# Patient Record
Sex: Female | Born: 1975
Health system: Southern US, Community
[De-identification: ages and names within clinical notes are randomized; demographics above are authoritative.]

## PROBLEM LIST (undated history)

## (undated) DIAGNOSIS — N611 Abscess of the breast and nipple: Secondary | ICD-10-CM

## (undated) DIAGNOSIS — C801 Malignant (primary) neoplasm, unspecified: Secondary | ICD-10-CM

## (undated) HISTORY — DX: Abscess of the breast and nipple: N61.1

## (undated) HISTORY — PX: BREAST LUMPECTOMY: SHX2

---

## 2009-11-24 HISTORY — PX: APPENDECTOMY: SHX54

## 2012-09-27 ENCOUNTER — Other Ambulatory Visit: Payer: Self-pay | Admitting: Family Medicine

## 2012-09-27 DIAGNOSIS — R1903 Right lower quadrant abdominal swelling, mass and lump: Secondary | ICD-10-CM

## 2012-09-29 ENCOUNTER — Ambulatory Visit
Admission: RE | Admit: 2012-09-29 | Discharge: 2012-09-29 | Disposition: A | Discharge status: Home / Self Care | Payer: BC Managed Care – PPO | Source: Ambulatory Visit | Attending: Family Medicine | Admitting: Family Medicine

## 2012-09-29 DIAGNOSIS — R1903 Right lower quadrant abdominal swelling, mass and lump: Secondary | ICD-10-CM

## 2012-09-29 MED ORDER — IOHEXOL 300 MG/ML  SOLN
100.0000 mL | Freq: Once | INTRAMUSCULAR | Status: AC | PRN
  Administered 2012-09-29: 100 mL via INTRAVENOUS

## 2013-08-15 ENCOUNTER — Other Ambulatory Visit: Payer: Self-pay

## 2013-08-18 ENCOUNTER — Encounter (HOSPITAL_COMMUNITY): Payer: Self-pay | Admitting: *Deleted

## 2013-08-18 ENCOUNTER — Emergency Department (HOSPITAL_COMMUNITY)
Admission: EM | Admit: 2013-08-18 | Discharge: 2013-08-18 | Disposition: A | Discharge status: Home / Self Care | Payer: BC Managed Care – PPO | Attending: Emergency Medicine | Admitting: Emergency Medicine

## 2013-08-18 ENCOUNTER — Emergency Department (HOSPITAL_COMMUNITY): Payer: BC Managed Care – PPO

## 2013-08-18 ENCOUNTER — Other Ambulatory Visit: Payer: Self-pay | Admitting: Family Medicine

## 2013-08-18 DIAGNOSIS — N6452 Nipple discharge: Secondary | ICD-10-CM

## 2013-08-18 DIAGNOSIS — N63 Unspecified lump in unspecified breast: Secondary | ICD-10-CM | POA: Insufficient documentation

## 2013-08-18 DIAGNOSIS — N6310 Unspecified lump in the right breast, unspecified quadrant: Secondary | ICD-10-CM

## 2013-08-18 DIAGNOSIS — N6459 Other signs and symptoms in breast: Secondary | ICD-10-CM | POA: Insufficient documentation

## 2013-08-18 LAB — BASIC METABOLIC PANEL
CO2: 25 mEq/L (ref 19–32)
Chloride: 102 mEq/L (ref 96–112)
GFR calc Af Amer: >90 mL/min (ref >90)
GFR calc non Af Amer: >90 mL/min (ref >90)
Potassium: 3.9 mEq/L (ref 3.5–5.1)
Sodium: 137 mEq/L (ref 135–145)

## 2013-08-18 LAB — CBC WITH DIFFERENTIAL/PLATELET
Basophils Relative: 0 % (ref 0–1)
Eosinophils Absolute: 0.1 10*3/uL (ref 0.0–0.7)
HCT: 36.6 % (ref 36.0–46.0)
Lymphocytes Relative: 31 % (ref 12–46)
MCH: 31 pg (ref 26.0–34.0)
MCHC: 34.7 g/dL (ref 30.0–36.0)
Monocytes Absolute: 0.9 10*3/uL (ref 0.1–1.0)
Neutrophils Relative %: 58 % (ref 43–77)
Platelets: 295 10*3/uL (ref 150–400)

## 2013-08-18 MED ORDER — HYDROCODONE-ACETAMINOPHEN 5-325 MG PO TABS: 2.0000 | ORAL_TABLET | ORAL | Status: DC | PRN

## 2013-08-18 NOTE — ED Notes (Addendum)
Reports pain to right breast since Monday, has been to pcp and started on antibiotics but now pt also having a discharge from right nipple. No acute distress noted at triage. Denies fever/chills

## 2013-08-18 NOTE — ED Provider Notes (Signed)
CSN: [id number]     Arrival date & time 08/18/13  1835 History  This chart was scribed for non-physician practitioner, Marlon Pel, PA-C working with Junius Argyle, MD by Greggory Stallion, ED scribe. This patient was seen in room TR09C/TR09C and the patient's care was started at 8:09 PM.   Chief Complaint  Patient presents with  . Abscess   The history is provided by the patient. No language interpreter was used.    HPI Comments: Kaylee Brewer is a 37 y.o. female who presents to the Emergency Department complaining of gradual onset, constant right breast pain that started 6 days ago. She has been to her PCP 3 days ago and was given antibiotics Keflex and started it 2 days ago. Pt states when she pressed her nipple yesterday and had a white discharge. She states there is only pain now when she presses on her right breast or when she stands. Pt states when she doesn't have a bra on, the pain worsens. Pt denies fever and chills.  History reviewed. No pertinent past medical history. History reviewed. No pertinent past surgical history. History reviewed. No pertinent family history. History  Substance Use Topics  . Smoking status: Never Smoker   . Smokeless tobacco: Not on file  . Alcohol Use: No   OB History   Grav Para Term Preterm Abortions TAB SAB Ect Mult Living                 Review of Systems  Constitutional: Negative for fever and chills.  All other systems reviewed and are negative.    Allergies  Review of patient's allergies indicates no known allergies.  Home Medications   Current Outpatient Rx  Name  Route  Sig  Dispense  Refill  . cephALEXin (KEFLEX) 500 MG capsule   Oral   Take 500 mg by mouth every 6 (six) hours.         Marland Kitchen ibuprofen (ADVIL,MOTRIN) 200 MG tablet   Oral   Take 200 mg by mouth every 6 (six) hours as needed for pain.         Marland Kitchen HYDROcodone-acetaminophen (NORCO/VICODIN) 5-325 MG per tablet   Oral   Take 2 tablets by mouth every 4 (four)  hours as needed for pain.   20 tablet   0    BP 102/70  Pulse 72  Temp(Src) 98.1 F (36.7 C) (Oral)  Resp 16  SpO2 99%  LMP 07/30/2013  Physical Exam  Nursing note and vitals reviewed. Constitutional: She is oriented to person, place, and time. She appears well-developed and well-nourished. No distress.  HENT:  Head: Normocephalic and atraumatic.  Eyes: EOM are normal.  Neck: Neck supple. No tracheal deviation present.  Cardiovascular: Normal rate.   Pulmonary/Chest: Effort normal. No respiratory distress. Right breast exhibits mass, nipple discharge and tenderness. Right breast exhibits no inverted nipple and no skin change. Left breast exhibits no inverted nipple, no mass, no nipple discharge, no skin change and no tenderness.    Musculoskeletal: Normal range of motion.  Neurological: She is alert and oriented to person, place, and time.  Skin: Skin is warm and dry.  Psychiatric: She has a normal mood and affect. Her behavior is normal.    ED Course  Procedures (including critical care time)  DIAGNOSTIC STUDIES: Oxygen Saturation is 99% on RA, normal by my interpretation.    COORDINATION OF CARE: 8:12 PM-Discussed treatment plan which includes speaking with Dr. Romeo Apple with pt at bedside and pt agreed to  plan.   8:31 PM-Dr. Romeo Apple suggested doing an ultrasound. Pt denies pain medication at this time.   Labs Review Labs Reviewed  CBC WITH DIFFERENTIAL  BASIC METABOLIC PANEL   Imaging Review US Breast Right  08/18/2013   CLINICAL DATA:  Right breast swelling. Question abscess.  EXAM: ULTRASOUND RIGHT BREAST  COMPARISON:  None.  FINDINGS: This examination was performed through the emergency department specifically to evaluate for a possible breast abscess. There is mild parenchymal heterogeneity without focal fluid collection.  IMPRESSION: No evidence of right breast abscess.  RECOMMENDATION: Close clinical follow up. If there are persistent or progressive symptoms,  dedicated evaluation in the Breast Center recommended.   Electronically Signed   By: Roxy Horseman   On: 08/18/2013 22:25    MDM   1. Breast mass   2. Nipple discharge    Ultrasound r/o abscess. I spoke with gynecology (who does not specialize in breasts) and they advise that patients needs mammogram tomorrow and to treat pain. Pt has no white count and vital signs are stable.  I discussed labs and ultrasound results with the patient. She is understanding. She informs me that she has a mammogram scheduled for 8am tomorrow morning that was ordered by her PCP. I advised her that she really really needs to go to this appointment.  Rx: Vicodin Continue Keflex  37 y.o.Kaylee Brewer evaluation in the Emergency Department is complete. It has been determined that no acute conditions requiring further emergency intervention are present at this time. The patient/guardian have been advised of the diagnosis and plan. We have discussed signs and symptoms that warrant return to the ED, such as changes or worsening in symptoms.  Vital signs are stable at discharge. Filed Vitals:   08/18/13 2313  BP: 100/67  Pulse: 76  Temp: 98.4 F (36.9 C)  Resp: 16    Patient/guardian has voiced understanding and agreed to follow-up with the PCP or specialist.  I personally performed the services described in this documentation, which was scribed in my presence. The recorded information has been reviewed and is accurate.      Dorthula Matas, PA-C 08/18/13 2315

## 2013-08-19 NOTE — ED Provider Notes (Signed)
Medical screening examination/treatment/procedure(s) were conducted as a shared visit with non-physician practitioner(s) and myself.  I personally evaluated the patient during the encounter  I interviewed and examined the patient. Lungs are CTAB. Cardiac exam wnl. Abdomen soft. Breast mass in right breast, LUQ. Yellowish nipple d/c expressed on exam.  [place] w/out evidence of abscess. Case discussed by Neva Seat PA w/ on call ob/gyn. Will have pt continue abx and f/u w/ breast center.   Junius Argyle, MD 08/19/13 [phone]

## 2013-08-29 ENCOUNTER — Other Ambulatory Visit: Payer: BC Managed Care – PPO

## 2013-10-24 ENCOUNTER — Encounter (INDEPENDENT_AMBULATORY_CARE_PROVIDER_SITE_OTHER): Payer: Self-pay | Admitting: Surgery

## 2013-10-24 ENCOUNTER — Ambulatory Visit (INDEPENDENT_AMBULATORY_CARE_PROVIDER_SITE_OTHER): Payer: BC Managed Care – PPO | Admitting: Surgery

## 2013-10-24 ENCOUNTER — Encounter (INDEPENDENT_AMBULATORY_CARE_PROVIDER_SITE_OTHER): Payer: Self-pay

## 2013-10-24 VITALS — BP 110/60 | HR 84 | Temp 97.8°F | Resp 18 | Ht 64.5 in | Wt 124.0 lb

## 2013-10-24 DIAGNOSIS — N611 Abscess of the breast and nipple: Secondary | ICD-10-CM

## 2013-10-24 DIAGNOSIS — N61 Mastitis without abscess: Secondary | ICD-10-CM

## 2013-10-24 MED ORDER — SULFAMETHOXAZOLE-TMP DS 800-160 MG PO TABS: 1.0000 | ORAL_TABLET | Freq: Two times a day (BID) | ORAL | Status: DC

## 2013-10-24 NOTE — Progress Notes (Signed)
Subjective:     Patient ID: Kaylee Brewer, female   DOB: Apr 25, 1976, 37 y.o.   MRN: [id number]  HPI She presents to our emergency office referred by Dr. Mila Palmer For evaluation of a right breast abscess. She apparently had cellulitis of the breast in September. Mammogram and ultrasound that time were unremarkable. She was placed on antibiotics. She reports she then had trauma to the right breast and developed a hematoma. She now has pain in the breast. She apparently has had an ultrasound and mammogram at [facility].  Review of Systems     Objective:   Physical Exam On the right breast there is an area of fluctuance with minimal erythema above the areola. I inserted an 18-gauge needle and found purulence. This a larger incision with a scalpel entering an abscess cavity. I then packed the wound with gauze    Assessment:     Right breast abscess     Plan:     She will start wet-to-dry dressing changes twice daily and I will see her back in one week.

## 2013-10-25 ENCOUNTER — Ambulatory Visit (INDEPENDENT_AMBULATORY_CARE_PROVIDER_SITE_OTHER): Payer: BC Managed Care – PPO

## 2013-10-25 DIAGNOSIS — Z4800 Encounter for change or removal of nonsurgical wound dressing: Secondary | ICD-10-CM

## 2013-10-25 NOTE — Progress Notes (Signed)
Pt is s/p I & D of a right breast abscess by Dr. Magnus Ivan on 10/24/13.  She is here today for a dressing change, and to observe so she can change same BID as prescribed by Dr. Magnus Ivan.  Outer bandage was removed along with the gauze packing.  Very minimal bleeding.  Wound was repacked with a saline moistened 2x2, then covered with 4x4 gauze and tape.  The pt now feels more comfortable about changing the dressing herself.  She is to return to see Dr. Magnus Ivan 11/03/13 at 4:10pm.

## 2013-10-26 ENCOUNTER — Ambulatory Visit (INDEPENDENT_AMBULATORY_CARE_PROVIDER_SITE_OTHER): Payer: BC Managed Care – PPO | Admitting: General Surgery

## 2013-10-28 ENCOUNTER — Ambulatory Visit (INDEPENDENT_AMBULATORY_CARE_PROVIDER_SITE_OTHER): Payer: BC Managed Care – PPO | Admitting: Surgery

## 2013-11-03 ENCOUNTER — Encounter (INDEPENDENT_AMBULATORY_CARE_PROVIDER_SITE_OTHER): Payer: Self-pay | Admitting: Surgery

## 2013-11-03 ENCOUNTER — Ambulatory Visit (INDEPENDENT_AMBULATORY_CARE_PROVIDER_SITE_OTHER): Payer: BC Managed Care – PPO | Admitting: Surgery

## 2013-11-03 VITALS — BP 98/68 | HR 68 | Temp 97.3°F | Resp 14 | Ht 64.0 in | Wt 126.2 lb

## 2013-11-03 DIAGNOSIS — Z09 Encounter for follow-up examination after completed treatment for conditions other than malignant neoplasm: Secondary | ICD-10-CM

## 2013-11-03 NOTE — Patient Instructions (Signed)
Pack the wound only once a day now for one week and then you can stop packing the wound  Ok to was the wound with soap and water in the shower

## 2013-11-03 NOTE — Progress Notes (Signed)
Subjective:     Patient ID: Kaylee Brewer, female   DOB: 07/08/1976, 37 y.o.   MRN: [id number]  HPI She is here for her first postop visit status post incision and drainage of a right breast abscess. She is packing the abscess cavity twice a day. She is doing well. She just finished her antibiotics  Review of Systems     Objective:   Physical Exam On exam, the small abscess cavity is healing well. There is no purulence and no cellulitis    Assessment:     Resolving right breast abscess     Plan:     She will do the packing only once a day now for 1 more week and then stop packing the wound. I will see her back in 3 weeks

## 2013-11-21 ENCOUNTER — Encounter (INDEPENDENT_AMBULATORY_CARE_PROVIDER_SITE_OTHER): Payer: BC Managed Care – PPO | Admitting: Surgery

## 2014-01-25 LAB — OB RESULTS CONSOLE HIV ANTIBODY (ROUTINE TESTING): HIV: NONREACTIVE

## 2014-01-25 LAB — OB RESULTS CONSOLE ANTIBODY SCREEN: Antibody Screen: NEGATIVE

## 2014-01-25 LAB — OB RESULTS CONSOLE ABO/RH: RH TYPE: NEGATIVE

## 2014-01-25 LAB — OB RESULTS CONSOLE RUBELLA ANTIBODY, IGM: Rubella: IMMUNE

## 2014-01-25 LAB — OB RESULTS CONSOLE HEPATITIS B SURFACE ANTIGEN: Hepatitis B Surface Ag: NEGATIVE

## 2014-01-25 LAB — OB RESULTS CONSOLE RPR: RPR: NONREACTIVE

## 2014-02-07 LAB — OB RESULTS CONSOLE GC/CHLAMYDIA
CHLAMYDIA, DNA PROBE: NEGATIVE
Gonorrhea: NEGATIVE

## 2014-08-02 LAB — OB RESULTS CONSOLE GBS: GBS: POSITIVE

## 2014-08-27 ENCOUNTER — Encounter (HOSPITAL_COMMUNITY): Payer: BC Managed Care – PPO | Admitting: Anesthesiology

## 2014-08-27 ENCOUNTER — Inpatient Hospital Stay (HOSPITAL_COMMUNITY): Payer: BC Managed Care – PPO | Admitting: Anesthesiology

## 2014-08-27 ENCOUNTER — Inpatient Hospital Stay (HOSPITAL_COMMUNITY)
Admission: AD | Admit: 2014-08-27 | Discharge: 2014-08-27 | Disposition: A | Discharge status: Home / Self Care | Payer: BC Managed Care – PPO | Source: Ambulatory Visit | Attending: Obstetrics and Gynecology | Admitting: Obstetrics and Gynecology

## 2014-08-27 ENCOUNTER — Encounter (HOSPITAL_COMMUNITY): Payer: Self-pay | Admitting: *Deleted

## 2014-08-27 ENCOUNTER — Inpatient Hospital Stay (HOSPITAL_COMMUNITY)
Admission: AD | Admit: 2014-08-27 | Discharge: 2014-08-29 | DRG: 775 | Disposition: A | Discharge status: Home / Self Care | Payer: BC Managed Care – PPO | Source: Ambulatory Visit | Attending: Obstetrics and Gynecology | Admitting: Obstetrics and Gynecology

## 2014-08-27 DIAGNOSIS — O09523 Supervision of elderly multigravida, third trimester: Secondary | ICD-10-CM

## 2014-08-27 DIAGNOSIS — Z3A39 39 weeks gestation of pregnancy: Secondary | ICD-10-CM | POA: Diagnosis present

## 2014-08-27 DIAGNOSIS — IMO0001 Reserved for inherently not codable concepts without codable children: Secondary | ICD-10-CM

## 2014-08-27 DIAGNOSIS — Z3483 Encounter for supervision of other normal pregnancy, third trimester: Secondary | ICD-10-CM | POA: Diagnosis present

## 2014-08-27 LAB — RPR

## 2014-08-27 LAB — CBC
HCT: 37.2 % (ref 36.0–46.0)
Hemoglobin: 12.7 g/dL (ref 12.0–15.0)
MCH: 32 pg (ref 26.0–34.0)
MCHC: 34.1 g/dL (ref 30.0–36.0)
MCV: 93.7 fL (ref 78.0–100.0)
PLATELETS: 224 10*3/uL (ref 150–400)
RBC: 3.97 MIL/uL (ref 3.87–5.11)
RDW: 15.3 % (ref 11.5–15.5)
WBC: 21.1 10*3/uL — ABNORMAL HIGH (ref 4.0–10.5)

## 2014-08-27 MED ORDER — EPHEDRINE 5 MG/ML INJ: 10.0000 mg | INTRAVENOUS | Status: DC | PRN

## 2014-08-27 MED ORDER — WITCH HAZEL-GLYCERIN EX PADS: 1.0000 "application " | MEDICATED_PAD | CUTANEOUS | Status: DC | PRN

## 2014-08-27 MED ORDER — OXYTOCIN BOLUS FROM INFUSION: 500.0000 mL | INTRAVENOUS | Status: DC

## 2014-08-27 MED ORDER — OXYCODONE-ACETAMINOPHEN 5-325 MG PO TABS: 1.0000 | ORAL_TABLET | ORAL | Status: DC | PRN

## 2014-08-27 MED ORDER — FLEET ENEMA 7-19 GM/118ML RE ENEM: 1.0000 | ENEMA | Freq: Every day | RECTAL | Status: DC | PRN

## 2014-08-27 MED ORDER — SODIUM CHLORIDE 0.9 % IV SOLN
2.0000 g | Freq: Once | INTRAVENOUS | Status: AC
  Administered 2014-08-27: 2 g via INTRAVENOUS
  Filled 2014-08-27: qty 2000

## 2014-08-27 MED ORDER — IBUPROFEN 600 MG PO TABS
600.0000 mg | ORAL_TABLET | Freq: Four times a day (QID) | ORAL | Status: DC
  Administered 2014-08-27 – 2014-08-29 (×6): 600 mg via ORAL
  Filled 2014-08-27 (×6): qty 1

## 2014-08-27 MED ORDER — BUTORPHANOL TARTRATE 1 MG/ML IJ SOLN
1.0000 mg | INTRAMUSCULAR | Status: DC | PRN
  Administered 2014-08-27: 1 mg via INTRAVENOUS
  Filled 2014-08-27: qty 1

## 2014-08-27 MED ORDER — LACTATED RINGERS IV SOLN: 500.0000 mL | Freq: Once | INTRAVENOUS | Status: DC

## 2014-08-27 MED ORDER — CITRIC ACID-SODIUM CITRATE 334-500 MG/5ML PO SOLN: 30.0000 mL | ORAL | Status: DC | PRN

## 2014-08-27 MED ORDER — LIDOCAINE HCL (PF) 1 % IJ SOLN
INTRAMUSCULAR | Status: DC | PRN
  Administered 2014-08-27 (×2): 5 mL

## 2014-08-27 MED ORDER — DIPHENHYDRAMINE HCL 50 MG/ML IJ SOLN: 12.5000 mg | INTRAMUSCULAR | Status: DC | PRN

## 2014-08-27 MED ORDER — ONDANSETRON HCL 4 MG/2ML IJ SOLN: 4.0000 mg | INTRAMUSCULAR | Status: DC | PRN

## 2014-08-27 MED ORDER — LANOLIN HYDROUS EX OINT: TOPICAL_OINTMENT | CUTANEOUS | Status: DC | PRN

## 2014-08-27 MED ORDER — LIDOCAINE HCL (PF) 1 % IJ SOLN
30.0000 mL | INTRAMUSCULAR | Status: DC | PRN
  Filled 2014-08-27: qty 30

## 2014-08-27 MED ORDER — ACETAMINOPHEN 325 MG PO TABS: 650.0000 mg | ORAL_TABLET | ORAL | Status: DC | PRN

## 2014-08-27 MED ORDER — ONDANSETRON HCL 4 MG/2ML IJ SOLN: 4.0000 mg | Freq: Four times a day (QID) | INTRAMUSCULAR | Status: DC | PRN

## 2014-08-27 MED ORDER — ONDANSETRON HCL 4 MG PO TABS
4.0000 mg | ORAL_TABLET | ORAL | Status: DC | PRN
Start: 2014-08-27 — End: 2014-08-29

## 2014-08-27 MED ORDER — FLEET ENEMA 7-19 GM/118ML RE ENEM: 1.0000 | ENEMA | RECTAL | Status: DC | PRN

## 2014-08-27 MED ORDER — LACTATED RINGERS IV SOLN: 500.0000 mL | INTRAVENOUS | Status: DC | PRN

## 2014-08-27 MED ORDER — SIMETHICONE 80 MG PO CHEW: 80.0000 mg | CHEWABLE_TABLET | ORAL | Status: DC | PRN

## 2014-08-27 MED ORDER — OXYCODONE-ACETAMINOPHEN 5-325 MG PO TABS: 2.0000 | ORAL_TABLET | ORAL | Status: DC | PRN

## 2014-08-27 MED ORDER — ZOLPIDEM TARTRATE 5 MG PO TABS: 5.0000 mg | ORAL_TABLET | Freq: Every evening | ORAL | Status: DC | PRN

## 2014-08-27 MED ORDER — PHENYLEPHRINE 40 MCG/ML (10ML) SYRINGE FOR IV PUSH (FOR BLOOD PRESSURE SUPPORT): 80.0000 ug | PREFILLED_SYRINGE | INTRAVENOUS | Status: DC | PRN

## 2014-08-27 MED ORDER — DIPHENHYDRAMINE HCL 25 MG PO CAPS: 25.0000 mg | ORAL_CAPSULE | Freq: Four times a day (QID) | ORAL | Status: DC | PRN

## 2014-08-27 MED ORDER — LACTATED RINGERS IV SOLN
INTRAVENOUS | Status: DC
  Administered 2014-08-27 (×2): via INTRAVENOUS

## 2014-08-27 MED ORDER — DIBUCAINE 1 % RE OINT
1.0000 "application " | TOPICAL_OINTMENT | RECTAL | Status: DC | PRN
  Filled 2014-08-27: qty 28

## 2014-08-27 MED ORDER — BENZOCAINE-MENTHOL 20-0.5 % EX AERO
1.0000 | INHALATION_SPRAY | CUTANEOUS | Status: DC | PRN
Start: 2014-08-27 — End: 2014-08-29
  Filled 2014-08-27 (×2): qty 56

## 2014-08-27 MED ORDER — TETANUS-DIPHTH-ACELL PERTUSSIS 5-2.5-18.5 LF-MCG/0.5 IM SUSP: 0.5000 mL | Freq: Once | INTRAMUSCULAR | Status: DC

## 2014-08-27 MED ORDER — PHENYLEPHRINE 40 MCG/ML (10ML) SYRINGE FOR IV PUSH (FOR BLOOD PRESSURE SUPPORT)
PREFILLED_SYRINGE | INTRAVENOUS | Status: AC
  Filled 2014-08-27: qty 5

## 2014-08-27 MED ORDER — FENTANYL 2.5 MCG/ML BUPIVACAINE 1/10 % EPIDURAL INFUSION (WH - ANES)
INTRAMUSCULAR | Status: AC
  Administered 2014-08-27: 14 mL/h via EPIDURAL
  Filled 2014-08-27: qty 125

## 2014-08-27 MED ORDER — LEVOTHYROXINE SODIUM 25 MCG PO TABS
25.0000 ug | ORAL_TABLET | Freq: Every day | ORAL | Status: DC
  Filled 2014-08-27: qty 1

## 2014-08-27 MED ORDER — FENTANYL 2.5 MCG/ML BUPIVACAINE 1/10 % EPIDURAL INFUSION (WH - ANES): 14.0000 mL/h | INTRAMUSCULAR | Status: DC | PRN

## 2014-08-27 MED ORDER — PRENATAL MULTIVITAMIN CH
1.0000 | ORAL_TABLET | Freq: Every day | ORAL | Status: DC
  Administered 2014-08-28: 1 via ORAL
  Filled 2014-08-27: qty 1

## 2014-08-27 MED ORDER — SENNOSIDES-DOCUSATE SODIUM 8.6-50 MG PO TABS
2.0000 | ORAL_TABLET | ORAL | Status: DC
  Administered 2014-08-28: 2 via ORAL
  Filled 2014-08-27: qty 2

## 2014-08-27 MED ORDER — OXYTOCIN 40 UNITS IN LACTATED RINGERS INFUSION - SIMPLE MED
62.5000 mL/h | INTRAVENOUS | Status: DC
  Filled 2014-08-27: qty 1000

## 2014-08-27 MED ORDER — BISACODYL 10 MG RE SUPP
10.0000 mg | Freq: Every day | RECTAL | Status: DC | PRN
  Filled 2014-08-27: qty 1

## 2014-08-27 NOTE — MAU Note (Signed)
Assumed care of patient.

## 2014-08-27 NOTE — H&P (Signed)
Kaylee Brewer is a 38 y.o. female presenting for contractions. No ROM. Maternal Medical History:  Reason for admission: Contractions.   Fetal activity: Perceived fetal activity is normal.      OB History   Grav Para Term Preterm Abortions TAB SAB Ect Mult Living   3 1 1  1 1    1      Past Medical History  Diagnosis Date  . Breast abscess    Past Surgical History  Procedure Laterality Date  . Appendectomy  2011   Family History: family history includes Thyroid cancer in her mother; Uterine cancer in her mother. Social History:  reports that she has never smoked. She has never used smokeless tobacco. She reports that she does not drink alcohol or use illicit drugs.   Prenatal Transfer Tool  Maternal Diabetes: No Genetic Screening: Normal Maternal Ultrasounds/Referrals: Normal Fetal Ultrasounds or other Referrals:  None Maternal Substance Abuse:  No Significant Maternal Medications:  Meds include: Syntroid Significant Maternal Lab Results:  None Other Comments:  None  Review of Systems  Eyes: Negative for blurred vision.  Gastrointestinal: Negative for abdominal pain.  Neurological: Negative for headaches.      Blood pressure 126/66, pulse 85, temperature 98.5 F (36.9 C), temperature source Oral, resp. rate 20, height 5' 4.96" (1.65 m), weight 70.308 kg (155 lb), last menstrual period 11/24/2013. Maternal Exam:  Uterine Assessment: Contraction strength is firm.  Contraction frequency is regular.      Fetal Exam Fetal State Assessment: Category I - tracings are normal.     Physical Exam  Cardiovascular: Normal rate.   Respiratory: Effort normal.  Neurological: She has normal reflexes.    Prenatal labs: ABO, Rh:   Antibody:   Rubella:   RPR:    HBsAg:    HIV:    GBS:     Assessment/Plan: 38 yo G3P1 at 46 3/7 weeks in active labor   Kaylee Brewer,Kaylee Brewer 08/27/2014, 5:03 PM

## 2014-08-27 NOTE — Anesthesia Procedure Notes (Signed)
Epidural Patient location during procedure: OB Start time: 08/27/2014 5:26 PM  Staffing Anesthesiologist: Rudean Curt Performed by: anesthesiologist   Preanesthetic Checklist Completed: patient identified, site marked, surgical consent, pre-op evaluation, timeout performed, IV checked, risks and benefits discussed and monitors and equipment checked  Epidural Patient position: sitting Prep: site prepped and draped and DuraPrep Patient monitoring: continuous pulse ox and blood pressure Approach: midline Location: L3-L4 Injection technique: LOR air  Needle:  Needle type: Tuohy  Needle gauge: 17 G Needle length: 9 cm and 9 Needle insertion depth: 4 cm Catheter type: closed end flexible Catheter size: 19 Gauge Catheter at skin depth: 10 cm Test dose: negative  Assessment Events: blood not aspirated, injection not painful, no injection resistance, negative IV test and no paresthesia  Additional Notes Patient identified.  Risk benefits discussed including failed block, incomplete pain control, headache, nerve damage, paralysis, blood pressure changes, nausea, vomiting, reactions to medication both toxic or allergic, and postpartum back pain.  Patient expressed understanding and wished to proceed.  All questions were answered.  Sterile technique used throughout procedure and epidural site dressed with sterile barrier dressing. No paresthesia or other complications noted.The patient did not experience any signs of intravascular injection such as tinnitus or metallic taste in mouth nor signs of intrathecal spread such as rapid motor block. Please see nursing notes for vital signs.

## 2014-08-27 NOTE — Discharge Instructions (Signed)

## 2014-08-27 NOTE — Anesthesia Preprocedure Evaluation (Signed)

## 2014-08-27 NOTE — Progress Notes (Signed)
Delivery Note At 8:03 PM a viable female was delivered via Vaginal, Spontaneous Delivery (Presentation: ; Occiput Anterior).  APGAR: 8,9 ; weight pending .   Placenta status: Intact, Spontaneous.  Cord: 3 vessels with the following complications: None.  Cord pH: pending  Anesthesia: Epidural  Episiotomy: None Lacerations: 2nd degree;Perineal Suture Repair: 2.0 vicryl rapide Est. Blood Loss (mL):   Mom to postpartum.  Baby to Couplet care / Skin to Skin.  [name],[name] E 08/27/2014, 8:16 PM

## 2014-08-27 NOTE — MAU Note (Signed)
Pt presents to MAU with complaints of contractions and small amount of bright red bleeding this morning.

## 2014-08-27 NOTE — Progress Notes (Signed)
Cx rim/C/0 AROM of BBOW-clear FHT reactive UCs q2-3 min

## 2014-08-27 NOTE — MAU Note (Signed)
See this morning, contractions closer together.

## 2014-08-28 ENCOUNTER — Encounter (HOSPITAL_COMMUNITY): Payer: Self-pay

## 2014-08-28 LAB — CBC
HCT: 34.3 % — ABNORMAL LOW (ref 36.0–46.0)
Hemoglobin: 11.8 g/dL — ABNORMAL LOW (ref 12.0–15.0)
MCH: 32.3 pg (ref 26.0–34.0)
MCHC: 34.4 g/dL (ref 30.0–36.0)
MCV: 94 fL (ref 78.0–100.0)
Platelets: 197 10*3/uL (ref 150–400)
RBC: 3.65 MIL/uL — ABNORMAL LOW (ref 3.87–5.11)
RDW: 15.3 % (ref 11.5–15.5)
WBC: 17.3 10*3/uL — ABNORMAL HIGH (ref 4.0–10.5)

## 2014-08-28 MED ORDER — RHO D IMMUNE GLOBULIN 1500 UNIT/2ML IJ SOSY
300.0000 ug | PREFILLED_SYRINGE | Freq: Once | INTRAMUSCULAR | Status: AC
  Administered 2014-08-28: 300 ug via INTRAMUSCULAR
  Filled 2014-08-28: qty 2

## 2014-08-28 NOTE — Lactation Note (Signed)
This note was copied from the chart of Kaylee Brewer. Lactation Consultation Note: Initial visit with this experienced BF mom Baby now 22 hours old. She reports that baby has been nursing better. Having a little trouble getting latched to the left breast- nipple is a little flatter but he has latched to that breast. Last fed at 5 am. Mom heading to bathroom. Encouraged to place baby skin to skin to see if he will arouse and nurse. Reviewed feeding cues and encouraged to feed whenever she sees them. Skin to skin as much as possible today. Has not voided yet 2 stools Discussed cluster feeding. BF brochure given with resources for support after DC. No questions at present. To call for assist prn.  Patient Name: Kaylee Brewer BULAG'T Date: 08/28/2014 Reason for consult: Initial assessment   Maternal Data Formula Feeding for Exclusion: No Does the patient have breastfeeding experience prior to this delivery?: Yes  Feeding    LATCH Score/Interventions                      Lactation Tools Discussed/Used     Consult Status Consult Status: PRN    Truddie Crumble 08/28/2014, 10:32 AM

## 2014-08-28 NOTE — Anesthesia Postprocedure Evaluation (Signed)
Anesthesia Post Note  Patient: Kaylee Brewer  Procedure(s) Performed: * No procedures listed *  Anesthesia type: Epidural  Patient location: Mother/Baby  Post pain: Pain level controlled  Post assessment: Post-op Vital signs reviewed  Last Vitals:  Filed Vitals:   08/28/14 0515  BP: 113/69  Pulse: 68  Temp: 36.6 C  Resp: 20    Post vital signs: Reviewed  Level of consciousness:alert  Complications: No apparent anesthesia complications

## 2014-08-28 NOTE — Progress Notes (Signed)
Post Partum Day 1 Subjective: no complaints, up ad lib, voiding and tolerating PO  Objective: Blood pressure 113/69, pulse 68, temperature 97.9 F (36.6 C), temperature source Oral, resp. rate 20, height 5' 4.96" (1.65 m), weight 155 lb (70.308 kg), last menstrual period 11/24/2013, SpO2 98.00%, unknown if currently breastfeeding.  Physical Exam:  General: alert and cooperative Lochia: appropriate Uterine Fundus: firm Incision: perineum intact DVT Evaluation: No evidence of DVT seen on physical exam. Negative Homan's sign. No cords or calf tenderness. No significant calf/ankle edema.   Recent Labs  08/27/14 1602 08/28/14 0630  HGB 12.7 11.8*  HCT 37.2 34.3*    Assessment/Plan: Plan for discharge tomorrow   LOS: 1 day   Steve Youngberg G 08/28/2014, 8:29 AM

## 2014-08-29 LAB — RH IG WORKUP (INCLUDES ABO/RH)
ABO/RH(D): AB NEG
ANTIBODY SCREEN: POSITIVE
DAT, IgG: NEGATIVE
Fetal Screen: NEGATIVE
Gestational Age(Wks): 39.3
Unit division: 0

## 2014-08-29 MED ORDER — IBUPROFEN 600 MG PO TABS: 600.0000 mg | ORAL_TABLET | Freq: Four times a day (QID) | ORAL | Status: DC

## 2014-08-29 NOTE — Discharge Summary (Signed)
Obstetric Discharge Summary Reason for Admission: onset of labor Prenatal Procedures: ultrasound Intrapartum Procedures: spontaneous vaginal delivery Postpartum Procedures: none Complications-Operative and Postpartum: 2 degree perineal laceration Hemoglobin  Date Value Ref Range Status  08/28/2014 11.8* 12.0 - 15.0 g/dL Final     HCT  Date Value Ref Range Status  08/28/2014 34.3* 36.0 - 46.0 % Final    Physical Exam:  General: alert and cooperative Lochia: appropriate Uterine Fundus: firm Incision: perineum intact DVT Evaluation: No evidence of DVT seen on physical exam. Negative Homan's sign. No cords or calf tenderness. No significant calf/ankle edema.  Discharge Diagnoses: Term Pregnancy-delivered  Discharge Information: Date: 08/29/2014 Activity: pelvic rest Diet: routine Medications: PNV, Ibuprofen and synthroid Condition: stable Instructions: refer to practice specific booklet Discharge to: home   Newborn Data: Live born female  Birth Weight: 7 lb 12.5 oz (3530 g) APGAR: 8,   Home with mother.  Sho Salguero G 08/29/2014, 8:34 AM

## 2014-08-29 NOTE — Lactation Note (Signed)
This note was copied from the chart of Kaylee Jailine Lieder. Lactation Consultation Note: follow up visit with mom before DC. She reports that baby has been nursing for hours through the night and her nipples are very sore. Gave a couple of bottles of formula because she needed a break. Both nipples pink with raw areas noted on tips. Has comfort gels. Encouraged to rub EBM into nipples after nursing and let them air dry. Reviewed wide open mouth and keeing the baby close to the breast throughout the feeding. Baby just finished feeding. Reports this happened with first baby too. Mom has fair skin, No further questions at present. To call prn  Patient Name: Kaylee Brewer AJGOT'L Date: 08/29/2014 Reason for consult: Follow-up assessment   Maternal Data Formula Feeding for Exclusion: No  Feeding Feeding Type: Breast Fed Length of feed: 70 min  LATCH Score/Interventions Latch: Grasps breast easily, tongue down, lips flanged, rhythmical sucking.  Audible Swallowing: Spontaneous and intermittent  Type of Nipple: Everted at rest and after stimulation  Comfort (Breast/Nipple): Engorged, cracked, bleeding, large blisters, severe discomfort  Problem noted: Cracked, bleeding, blisters, bruises Interventions  (Cracked/bleeding/bruising/blister): Expressed breast milk to nipple  Hold (Positioning): No assistance needed to correctly position infant at breast.  LATCH Score: 8  Lactation Tools Discussed/Used     Consult Status Consult Status: Complete    Truddie Crumble 08/29/2014, 11:01 AM

## 2014-09-14 ENCOUNTER — Encounter (HOSPITAL_COMMUNITY): Payer: Self-pay | Admitting: *Deleted

## 2014-09-19 ENCOUNTER — Other Ambulatory Visit: Payer: Self-pay | Admitting: Chiropractic Medicine

## 2014-09-19 ENCOUNTER — Ambulatory Visit
Admission: RE | Admit: 2014-09-19 | Discharge: 2014-09-19 | Disposition: A | Discharge status: Home / Self Care | Payer: BC Managed Care – PPO | Source: Ambulatory Visit | Attending: Chiropractic Medicine | Admitting: Chiropractic Medicine

## 2014-09-19 DIAGNOSIS — M533 Sacrococcygeal disorders, not elsewhere classified: Secondary | ICD-10-CM

## 2014-09-25 ENCOUNTER — Encounter (HOSPITAL_COMMUNITY): Payer: Self-pay | Admitting: *Deleted

## 2016-04-08 DIAGNOSIS — L821 Other seborrheic keratosis: Secondary | ICD-10-CM | POA: Diagnosis not present

## 2016-04-08 DIAGNOSIS — D1801 Hemangioma of skin and subcutaneous tissue: Secondary | ICD-10-CM | POA: Diagnosis not present

## 2016-04-08 DIAGNOSIS — D225 Melanocytic nevi of trunk: Secondary | ICD-10-CM | POA: Diagnosis not present

## 2016-04-08 DIAGNOSIS — D2271 Melanocytic nevi of right lower limb, including hip: Secondary | ICD-10-CM | POA: Diagnosis not present

## 2016-10-21 DIAGNOSIS — R946 Abnormal results of thyroid function studies: Secondary | ICD-10-CM | POA: Diagnosis not present

## 2016-12-16 DIAGNOSIS — D1801 Hemangioma of skin and subcutaneous tissue: Secondary | ICD-10-CM | POA: Diagnosis not present

## 2016-12-23 DIAGNOSIS — N760 Acute vaginitis: Secondary | ICD-10-CM | POA: Diagnosis not present

## 2017-09-10 DIAGNOSIS — R1030 Lower abdominal pain, unspecified: Secondary | ICD-10-CM | POA: Diagnosis not present

## 2017-10-06 DIAGNOSIS — Z1231 Encounter for screening mammogram for malignant neoplasm of breast: Secondary | ICD-10-CM | POA: Diagnosis not present

## 2017-12-11 DIAGNOSIS — M25512 Pain in left shoulder: Secondary | ICD-10-CM | POA: Diagnosis not present

## 2017-12-11 DIAGNOSIS — M7530 Calcific tendinitis of unspecified shoulder: Secondary | ICD-10-CM | POA: Diagnosis not present

## 2017-12-17 DIAGNOSIS — M25512 Pain in left shoulder: Secondary | ICD-10-CM | POA: Diagnosis not present

## 2017-12-21 DIAGNOSIS — M25512 Pain in left shoulder: Secondary | ICD-10-CM | POA: Diagnosis not present

## 2017-12-29 DIAGNOSIS — M25512 Pain in left shoulder: Secondary | ICD-10-CM | POA: Diagnosis not present

## 2018-09-03 DIAGNOSIS — Z01419 Encounter for gynecological examination (general) (routine) without abnormal findings: Secondary | ICD-10-CM | POA: Diagnosis not present

## 2018-09-03 DIAGNOSIS — Z6822 Body mass index (BMI) 22.0-22.9, adult: Secondary | ICD-10-CM | POA: Diagnosis not present

## 2018-09-03 DIAGNOSIS — N760 Acute vaginitis: Secondary | ICD-10-CM | POA: Diagnosis not present

## 2018-10-08 DIAGNOSIS — Z1231 Encounter for screening mammogram for malignant neoplasm of breast: Secondary | ICD-10-CM | POA: Diagnosis not present

## 2018-12-16 ENCOUNTER — Other Ambulatory Visit: Payer: Self-pay

## 2018-12-16 ENCOUNTER — Emergency Department
Admission: EM | Admit: 2018-12-16 | Discharge: 2018-12-16 | Disposition: A | Discharge status: Home / Self Care | Payer: BLUE CROSS/BLUE SHIELD | Source: Home / Self Care

## 2018-12-16 ENCOUNTER — Encounter: Payer: Self-pay | Admitting: Family Medicine

## 2018-12-16 DIAGNOSIS — J039 Acute tonsillitis, unspecified: Secondary | ICD-10-CM | POA: Diagnosis not present

## 2018-12-16 MED ORDER — CEFDINIR 300 MG PO CAPS: 600.0000 mg | ORAL_CAPSULE | Freq: Every day | ORAL | 0 refills | Status: DC

## 2018-12-16 MED ORDER — CHLORHEXIDINE GLUCONATE 0.12 % MT SOLN: 15.0000 mL | Freq: Two times a day (BID) | OROMUCOSAL | 0 refills | Status: DC

## 2018-12-16 NOTE — ED Provider Notes (Signed)
Kaylee Brewer CARE    CSN: [id number] Arrival date & time: 12/16/18  1253     History   Chief Complaint Chief Complaint  Patient presents with  . Sore Throat    in lymph node    HPI Kaylee Brewer is a 43 y.o. female.   This is a 43 year old woman who comes to the [facility] urgent care for the first time complaining of sore throat.  She is a Nurse, learning disability.  Pt c/o soreness in her LT lymph node neck area. Also sometimes radiates in LT ear. Feels shooting pain when she talks louder. No otc meds taken.       Past Medical History:  Diagnosis Date  . Breast abscess     Patient Active Problem List   Diagnosis Date Noted  . Active labor 08/27/2014  . Breast abscess of female 10/24/2013    Past Surgical History:  Procedure Laterality Date  . APPENDECTOMY  2011    OB History    Gravida  3   Para  2   Term  2   Preterm      AB  1   Living  2     SAB      TAB  1   Ectopic      Multiple      Live Births  2            Home Medications    Prior to Admission medications   Medication Sig Start Date End Date Taking? Authorizing Provider  B-Complex-C-Biotin-Fe & FA (DIALYVITE 800/IRON PO) Take 1 tablet by mouth daily.    [provider]  cefdinir (OMNICEF) 300 MG capsule Take 2 capsules (600 mg total) by mouth daily. 12/16/18   Robyn Haber, MD  chlorhexidine (PERIDEX) 0.12 % solution Use as directed 15 mLs in the mouth or throat 2 (two) times daily. 12/16/18   Robyn Haber, MD  ibuprofen (ADVIL,MOTRIN) 600 MG tablet Take 1 tablet (600 mg total) by mouth every 6 (six) hours. 08/29/14   Juanda Chance, NP  levothyroxine (SYNTHROID, LEVOTHROID) 25 MCG tablet Take 25 mcg by mouth daily before breakfast.    [provider]  Prenatal Vit-Fe Fumarate-FA (PRENATAL MULTIVITAMIN) TABS tablet Take 1 tablet by mouth daily at 12 noon.    [provider]    Family History Family History  Problem Relation Age of Onset    . Uterine cancer Mother   . Thyroid cancer Mother     Social History Social History   Tobacco Use  . Smoking status: Never Smoker  . Smokeless tobacco: Never Used  Substance Use Topics  . Alcohol use: No  . Drug use: No     Allergies   Patient has no known allergies.   Review of Systems Review of Systems   Physical Exam Triage Vital Signs ED Triage Vitals  Enc Vitals Group     BP      Pulse      Resp      Temp      Temp src      SpO2      Weight      Height      Head Circumference      Peak Flow      Pain Score      Pain Loc      Pain Edu?      Excl. in [place]?    No data found.  Updated Vital Signs BP 103/67 (BP Location: Right  Arm)   Pulse 78   Temp 97.6 F (36.4 C) (Oral)   Resp 16   Ht 5\' 4"  (1.626 m)   Wt 60.3 kg   SpO2 97%   BMI 22.83 kg/m    Physical Exam Vitals signs and nursing note reviewed.  Constitutional:      Appearance: She is well-developed.  HENT:     Head: Normocephalic.     Right Ear: Tympanic membrane normal.     Left Ear: Tympanic membrane normal.     Mouth/Throat:     Mouth: Mucous membranes are moist.     Pharynx: Uvula midline.     Tonsils: Tonsillar abscess present. Swelling: 0 on the right. 1+ on the left.  Eyes:     Conjunctiva/sclera: Conjunctivae normal.  Neck:     Musculoskeletal: Normal range of motion and neck supple.  Lymphadenopathy:     Cervical: Cervical adenopathy present.  Skin:    General: Skin is warm and dry.  Neurological:     General: No focal deficit present.     Mental Status: She is alert.  Psychiatric:        Mood and Affect: Mood normal.        Behavior: Behavior normal.      UC Treatments / Results  Labs (all labs ordered are listed, but only abnormal results are displayed) Labs Reviewed - No data to display  EKG None  Radiology No results found.  Procedures Procedures (including critical care time)  Medications Ordered in UC Medications - No data to display  Initial  Impression / Assessment and Plan / UC Course  I have reviewed the triage vital signs and the nursing notes.  Pertinent labs & imaging results that were available during my care of the patient were reviewed by me and considered in my medical decision making (see chart for details).    Final Clinical Impressions(s) / UC Diagnoses   Final diagnoses:  Acute tonsillitis, unspecified etiology   Discharge Instructions   None    ED Prescriptions    Medication Sig Dispense Auth. Provider   cefdinir (OMNICEF) 300 MG capsule Take 2 capsules (600 mg total) by mouth daily. 20 capsule Robyn Haber, MD   chlorhexidine (PERIDEX) 0.12 % solution Use as directed 15 mLs in the mouth or throat 2 (two) times daily. 120 mL Robyn Haber, MD     Controlled Substance Prescriptions [place] Controlled Substance Registry consulted? Not Applicable   Robyn Haber, MD 12/16/18 1319

## 2018-12-16 NOTE — ED Triage Notes (Signed)
Pt c/o soreness in her LT lymph node neck area. Also sometimes radiates in LT ear. Feels shooting pain when she talks louder. No otc meds taken.

## 2018-12-28 ENCOUNTER — Emergency Department
Admission: EM | Admit: 2018-12-28 | Discharge: 2018-12-28 | Disposition: A | Discharge status: Home / Self Care | Payer: BLUE CROSS/BLUE SHIELD | Source: Home / Self Care

## 2018-12-28 ENCOUNTER — Encounter: Payer: Self-pay | Admitting: Emergency Medicine

## 2018-12-28 ENCOUNTER — Other Ambulatory Visit: Payer: Self-pay

## 2018-12-28 DIAGNOSIS — J029 Acute pharyngitis, unspecified: Secondary | ICD-10-CM | POA: Diagnosis not present

## 2018-12-28 LAB — POCT RAPID STREP A (OFFICE): Rapid Strep A Screen: NEGATIVE

## 2018-12-28 LAB — POCT MONO SCREEN (KUC): Mono, POC: NEGATIVE

## 2018-12-28 MED ORDER — PREDNISONE 50 MG PO TABS: 50.0000 mg | ORAL_TABLET | Freq: Every day | ORAL | 0 refills | Status: AC

## 2018-12-28 NOTE — Discharge Instructions (Signed)
°  You may take 500mg  acetaminophen every 4-6 hours or in combination with ibuprofen 400-600mg  every 6-8 hours as needed for pain, inflammation, and fever.  Be sure to well hydrated with clear liquids and get at least 8 hours of sleep at night, preferably more while sick.   Please follow up with family medicine in 1 week if needed, or schedule a follow up appointment with an ear nose and throat (ENT) specialist.

## 2018-12-28 NOTE — ED Provider Notes (Signed)
[name]    CSN: [id number] Arrival date & time: 12/28/18  1253     History   Chief Complaint Chief Complaint  Patient presents with  . Sore Throat    HPI Kaylee Brewer is a 43 y.o. female.   HPI Kaylee Brewer is a 43 y.o. female presenting to UC with c/o continued Left side sore throat that started about 10 days ago. She was seen at [facility] on 12/16/2018, dx with acute tonsillitis and tx with 10 days of cefdinir. She completed the antibiotic and prescribed chlorhexidine mouthwash with mild relief. She is still able to eat and drink. No other symptoms. She has not tried any OTC medication, pt states because she has not had a fever. Denies n/v/d. No difficulty swallowing.   Past Medical History:  Diagnosis Date  . Breast abscess     Patient Active Problem List   Diagnosis Date Noted  . Active labor 08/27/2014  . Breast abscess of female 10/24/2013    Past Surgical History:  Procedure Laterality Date  . APPENDECTOMY  2011    OB History    Gravida  3   Para  2   Term  2   Preterm      AB  1   Living  2     SAB      TAB  1   Ectopic      Multiple      Live Births  2            Home Medications    Prior to Admission medications   Medication Sig Start Date End Date Taking? Authorizing Provider  B-Complex-C-Biotin-Fe & FA (DIALYVITE 800/IRON PO) Take 1 tablet by mouth daily.    [provider]  chlorhexidine (PERIDEX) 0.12 % solution Use as directed 15 mLs in the mouth or throat 2 (two) times daily. 12/16/18   Robyn Haber, MD  ibuprofen (ADVIL,MOTRIN) 600 MG tablet Take 1 tablet (600 mg total) by mouth every 6 (six) hours. 08/29/14   Juanda Chance, NP  levothyroxine (SYNTHROID, LEVOTHROID) 25 MCG tablet Take 25 mcg by mouth daily before breakfast.    [provider]  predniSONE (DELTASONE) 50 MG tablet Take 1 tablet (50 mg total) by mouth daily with breakfast for 5 days. 12/28/18 01/02/19  Noe Gens, PA-C  Prenatal  Vit-Fe Fumarate-FA (PRENATAL MULTIVITAMIN) TABS tablet Take 1 tablet by mouth daily at 12 noon.    [provider]    Family History Family History  Problem Relation Age of Onset  . Uterine cancer Mother   . Thyroid cancer Mother     Social History Social History   Tobacco Use  . Smoking status: Never Smoker  . Smokeless tobacco: Never Used  Substance Use Topics  . Alcohol use: No  . Drug use: No     Allergies   Patient has no known allergies.   Review of Systems Review of Systems  Constitutional: Negative for chills and fever.  HENT: Positive for sore throat. Negative for congestion, ear pain, trouble swallowing and voice change.   Respiratory: Negative for cough and shortness of breath.   Cardiovascular: Negative for chest pain and palpitations.  Gastrointestinal: Negative for abdominal pain, diarrhea, nausea and vomiting.  Musculoskeletal: Negative for arthralgias, back pain and myalgias.  Skin: Negative for rash.     Physical Exam Triage Vital Signs ED Triage Vitals  Enc Vitals Group     BP 12/28/18 1320 103/69  Pulse Rate 12/28/18 1320 84     Resp --      Temp 12/28/18 1320 98.8 F (37.1 C)     Temp Source 12/28/18 1320 Oral     SpO2 12/28/18 1320 97 %     Weight --      Height --      Head Circumference --      Peak Flow --      Pain Score 12/28/18 1321 7     Pain Loc --      Pain Edu? --      Excl. in [place]? --    No data found.  Updated Vital Signs BP 103/69   Pulse 84   Temp 98.8 F (37.1 C) (Oral)   SpO2 97%   Visual Acuity Right Eye Distance:   Left Eye Distance:   Bilateral Distance:    Right Eye Near:   Left Eye Near:    Bilateral Near:     Physical Exam Vitals signs and nursing note reviewed.  Constitutional:      Appearance: She is well-developed.  HENT:     Head: Normocephalic and atraumatic.     Right Ear: Tympanic membrane normal.     Left Ear: Tympanic membrane normal.     Nose: Nose normal.     Right  Sinus: No maxillary sinus tenderness or frontal sinus tenderness.     Left Sinus: No maxillary sinus tenderness or frontal sinus tenderness.     Mouth/Throat:     Lips: Pink.     Mouth: Mucous membranes are moist.     Pharynx: Oropharynx is clear. Uvula midline. Posterior oropharyngeal erythema present. No pharyngeal swelling, oropharyngeal exudate or uvula swelling.     Tonsils: No tonsillar exudate or tonsillar abscesses.  Neck:     Musculoskeletal: Normal range of motion and neck supple.  Cardiovascular:     Rate and Rhythm: Normal rate and regular rhythm.  Pulmonary:     Effort: Pulmonary effort is normal.     Breath sounds: Normal breath sounds.  Musculoskeletal: Normal range of motion.  Lymphadenopathy:     Cervical: No cervical adenopathy.  Skin:    General: Skin is warm and dry.  Neurological:     Mental Status: She is alert and oriented to person, place, and time.  Psychiatric:        Behavior: Behavior normal.      UC Treatments / Results  Labs (all labs ordered are listed, but only abnormal results are displayed) Labs Reviewed  POCT MONO SCREEN [facility])  POCT RAPID STREP A (OFFICE)    EKG None  Radiology No results found.  Procedures Procedures (including critical care time)  Medications Ordered in UC Medications - No data to display  Initial Impression / Assessment and Plan / UC Course  I have reviewed the triage vital signs and the nursing notes.  Pertinent labs & imaging results that were available during my care of the patient were reviewed by me and considered in my medical decision making (see chart for details).     Rapid strep and mono- negative Reassured pt, no tonsillar swelling or exudate noted today as it was reported in note from initial visit on 12/16/2018. Encouraged to try OTC pain medication such as Tylenol or Motrin as needed. Pt info packet provided.  Final Clinical Impressions(s) / UC Diagnoses   Final diagnoses:  Acute  pharyngitis, unspecified etiology     Discharge Instructions      You may  take 500mg  acetaminophen every 4-6 hours or in combination with ibuprofen 400-600mg  every 6-8 hours as needed for pain, inflammation, and fever.  Be sure to well hydrated with clear liquids and get at least 8 hours of sleep at night, preferably more while sick.   Please follow up with family medicine in 1 week if needed, or schedule a follow up appointment with an ear nose and throat (ENT) specialist.      ED Prescriptions    Medication Sig Dispense Auth. Provider   predniSONE (DELTASONE) 50 MG tablet Take 1 tablet (50 mg total) by mouth daily with breakfast for 5 days. 5 tablet Noe Gens, PA-C     Controlled Substance Prescriptions [place] Controlled Substance Registry consulted? Not Applicable   Tyrell Antonio 12/28/18 1407

## 2018-12-28 NOTE — ED Triage Notes (Signed)
Was treated 10 days ago with ABT, she is not better.LT tonsil pain

## 2018-12-29 ENCOUNTER — Telehealth: Payer: Self-pay

## 2018-12-29 LAB — STREP A DNA PROBE: Group A Strep Probe: NOT DETECTED

## 2018-12-29 NOTE — Telephone Encounter (Signed)
Spoke with patient, and gave neg strep results.

## 2019-07-11 DIAGNOSIS — E7800 Pure hypercholesterolemia, unspecified: Secondary | ICD-10-CM | POA: Diagnosis not present

## 2019-07-11 DIAGNOSIS — E039 Hypothyroidism, unspecified: Secondary | ICD-10-CM | POA: Diagnosis not present

## 2019-07-11 DIAGNOSIS — Z808 Family history of malignant neoplasm of other organs or systems: Secondary | ICD-10-CM | POA: Diagnosis not present

## 2019-09-25 ENCOUNTER — Emergency Department (HOSPITAL_COMMUNITY)
Admission: EM | Admit: 2019-09-25 | Discharge: 2019-09-25 | Discharge status: Against Medical Advice | Payer: BC Managed Care – PPO

## 2019-09-25 NOTE — ED Notes (Signed)
Patient stated, " I took pian medicine at home and now I feel better. 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

## 2019-09-25 NOTE — ED Notes (Signed)
Pt reports she took pills prior to coming in and now her pain is better and she'd like to leave. Instructed pt to come back if anything changed

## 2019-09-30 DIAGNOSIS — R109 Unspecified abdominal pain: Secondary | ICD-10-CM | POA: Diagnosis not present

## 2019-10-03 DIAGNOSIS — R109 Unspecified abdominal pain: Secondary | ICD-10-CM | POA: Diagnosis not present

## 2019-10-12 DIAGNOSIS — Z1231 Encounter for screening mammogram for malignant neoplasm of breast: Secondary | ICD-10-CM | POA: Diagnosis not present

## 2019-11-23 DIAGNOSIS — R05 Cough: Secondary | ICD-10-CM | POA: Diagnosis not present

## 2019-12-01 DIAGNOSIS — Z6822 Body mass index (BMI) 22.0-22.9, adult: Secondary | ICD-10-CM | POA: Diagnosis not present

## 2019-12-01 DIAGNOSIS — Z01419 Encounter for gynecological examination (general) (routine) without abnormal findings: Secondary | ICD-10-CM | POA: Diagnosis not present

## 2019-12-01 DIAGNOSIS — N760 Acute vaginitis: Secondary | ICD-10-CM | POA: Diagnosis not present

## 2020-04-21 ENCOUNTER — Emergency Department
Admission: EM | Admit: 2020-04-21 | Discharge: 2020-04-21 | Disposition: A | Discharge status: Home / Self Care | Payer: BC Managed Care – PPO | Source: Home / Self Care

## 2020-04-21 ENCOUNTER — Other Ambulatory Visit: Payer: Self-pay

## 2020-04-21 ENCOUNTER — Encounter: Payer: Self-pay | Admitting: Emergency Medicine

## 2020-04-21 DIAGNOSIS — J0100 Acute maxillary sinusitis, unspecified: Secondary | ICD-10-CM

## 2020-04-21 MED ORDER — IPRATROPIUM BROMIDE 0.06 % NA SOLN: 2.0000 | Freq: Four times a day (QID) | NASAL | 1 refills | Status: DC

## 2020-04-21 MED ORDER — AMOXICILLIN-POT CLAVULANATE 875-125 MG PO TABS: 1.0000 | ORAL_TABLET | Freq: Two times a day (BID) | ORAL | 0 refills | Status: DC

## 2020-04-21 MED ORDER — AMOXICILLIN 500 MG PO CAPS: 500.0000 mg | ORAL_CAPSULE | Freq: Three times a day (TID) | ORAL | 0 refills | Status: DC

## 2020-04-21 NOTE — ED Provider Notes (Signed)
[name]    CSN: [id number] Arrival date & time: 04/21/20  1314      History   Chief Complaint Chief Complaint  Patient presents with  . Facial Pain  . Cough    HPI Kaylee Brewer is a 44 y.o. female.   HPI Kaylee Brewer is a 44 y.o. female presenting to UC with c/o 2 weeks gradually worsening cough and sinus congestion.  Yesterday she coughed up green tinged mucous.  She has taken sudafed, ibuprofen, last dose 400mg  yesterday around 1900.  She is also experiencing sinus pressure. Denies fever, chills, n/v/d. No sick contacts or recent travel. 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   Pt is requesting an antibiotic.  Past Medical History:  Diagnosis Date  . Breast abscess     Patient Active Problem List   Diagnosis Date Noted  . Active labor 08/27/2014  . Breast abscess of female 10/24/2013    Past Surgical History:  Procedure Laterality Date  . APPENDECTOMY  2011    OB History    Gravida  3   Para  2   Term  2   Preterm      AB  1   Living  2     SAB      TAB  1   Ectopic      Multiple      Live Births  2            Home Medications    Prior to Admission medications   Medication Sig Start Date End Date Taking? Authorizing Provider  ibuprofen (ADVIL,MOTRIN) 600 MG tablet Take 1 tablet (600 mg total) by mouth every 6 (six) hours. 08/29/14  Yes Juanda Chance, NP  amoxicillin (AMOXIL) 500 MG capsule Take 1 capsule (500 mg total) by mouth 3 (three) times daily. 04/21/20   Noe Gens, PA-C  B-Complex-C-Biotin-Fe & FA (DIALYVITE 800/IRON PO) Take 1 tablet by mouth daily.    [provider]  chlorhexidine (PERIDEX) 0.12 % solution Use as directed 15 mLs in the mouth or throat 2 (two) times daily. 12/16/18   Robyn Haber, MD  ipratropium (ATROVENT) 0.06 % nasal spray Place 2 sprays into both nostrils 4 (four) times daily. 04/21/20   Noe Gens, PA-C  levothyroxine (SYNTHROID, LEVOTHROID) 25 MCG tablet  Take 25 mcg by mouth daily before breakfast.    [provider]  Prenatal Vit-Fe Fumarate-FA (PRENATAL MULTIVITAMIN) TABS tablet Take 1 tablet by mouth daily at 12 noon.    [provider]    Family History Family History  Problem Relation Age of Onset  . Uterine cancer Mother   . Thyroid cancer Mother     Social History Social History   Tobacco Use  . Smoking status: Never Smoker  . Smokeless tobacco: Never Used  Substance Use Topics  . Alcohol use: No  . Drug use: No     Allergies   Patient has no known allergies.   Review of Systems Review of Systems  Constitutional: Negative for chills and fever.  HENT: Positive for congestion and sinus pressure. Negative for ear pain, sore throat, trouble swallowing and voice change.   Respiratory: Positive for cough. Negative for shortness of breath.   Cardiovascular: Negative for chest pain and palpitations.  Gastrointestinal: Negative for abdominal pain, diarrhea, nausea and vomiting.  Musculoskeletal: Negative for arthralgias, back pain and myalgias.  Skin: Negative for rash.  Neurological: Negative for dizziness, light-headedness and headaches.  All other systems  reviewed and are negative.    Physical Exam Triage Vital Signs ED Triage Vitals  Enc Vitals Group     BP 04/21/20 1343 107/70     Pulse Rate 04/21/20 1343 86     Resp 04/21/20 1343 17     Temp 04/21/20 1343 98.7 F (37.1 C)     Temp Source 04/21/20 1343 Oral     SpO2 04/21/20 1343 98 %     Weight --      Height --      Head Circumference --      Peak Flow --      Pain Score 04/21/20 1347 2     Pain Loc --      Pain Edu? --      Excl. in [place]? --    No data found.  Updated Vital Signs BP 107/70 (BP Location: Right Arm)   Pulse 86   Temp 98.7 F (37.1 C) (Oral)   Resp 17   LMP 04/19/2020 (Exact Date)   SpO2 98%   Visual Acuity Right Eye Distance:   Left Eye Distance:   Bilateral Distance:    Right Eye Near:   Left Eye Near:     Bilateral Near:     Physical Exam Vitals and nursing note reviewed.  Constitutional:      Appearance: Normal appearance. She is well-developed.  HENT:     Head: Normocephalic and atraumatic.     Right Ear: Tympanic membrane and ear canal normal.     Left Ear: Tympanic membrane and ear canal normal.     Nose: Mucosal edema present.     Right Sinus: Maxillary sinus tenderness present. No frontal sinus tenderness.     Left Sinus: Maxillary sinus tenderness present. No frontal sinus tenderness.     Mouth/Throat:     Lips: Pink.     Mouth: Mucous membranes are moist.     Pharynx: Oropharynx is clear. Uvula midline. No pharyngeal swelling, oropharyngeal exudate, posterior oropharyngeal erythema or uvula swelling.  Cardiovascular:     Rate and Rhythm: Normal rate and regular rhythm.  Pulmonary:     Effort: Pulmonary effort is normal. No respiratory distress.     Breath sounds: Normal breath sounds. No stridor. No wheezing, rhonchi or rales.  Musculoskeletal:        General: Normal range of motion.     Cervical back: Normal range of motion.  Skin:    General: Skin is warm and dry.  Neurological:     Mental Status: She is alert and oriented to person, place, and time.  Psychiatric:        Behavior: Behavior normal.      UC Treatments / Results  Labs (all labs ordered are listed, but only abnormal results are displayed) Labs Reviewed - No data to display  EKG   Radiology No results found.  Procedures Procedures (including critical care time)  Medications Ordered in UC Medications - No data to display  Initial Impression / Assessment and Plan / UC Course  I have reviewed the triage vital signs and the nursing notes.  Pertinent labs & imaging results that were available during my care of the patient were reviewed by me and considered in my medical decision making (see chart for details).     Augmentin initially sent to pharmacy to tx for bacterial sinusitis, pt  requested "plain amoxicillin"  Antibiotic changed AVS provided  Final Clinical Impressions(s) / UC Diagnoses   Final diagnoses:  Acute non-recurrent  maxillary sinusitis     Discharge Instructions      You may take 500mg  acetaminophen every 4-6 hours or in combination with ibuprofen 400-600mg  every 6-8 hours as needed for pain, inflammation, and fever.  Be sure to well hydrated with clear liquids and get at least 8 hours of sleep at night, preferably more while sick.   Please follow up with family medicine in 1 week if needed.     ED Prescriptions    Medication Sig Dispense Auth. Provider   amoxicillin-clavulanate (AUGMENTIN) 875-125 MG tablet  (Status: Discontinued) Take 1 tablet by mouth 2 (two) times daily. One po bid x 7 days 14 tablet Shamecka Hocutt O, PA-C   ipratropium (ATROVENT) 0.06 % nasal spray  (Status: Discontinued) Place 2 sprays into both nostrils 4 (four) times daily. 15 mL Gerarda Fraction, Rosy Estabrook O, PA-C   amoxicillin-clavulanate (AUGMENTIN) 875-125 MG tablet  (Status: Discontinued) Take 1 tablet by mouth 2 (two) times daily. One po bid x 7 days 14 tablet Starlynn Klinkner O, PA-C   ipratropium (ATROVENT) 0.06 % nasal spray Place 2 sprays into both nostrils 4 (four) times daily. 15 mL Gerarda Fraction, Qunicy Higinbotham O, PA-C   amoxicillin (AMOXIL) 500 MG capsule Take 1 capsule (500 mg total) by mouth 3 (three) times daily. 21 capsule Noe Gens, [place]     PDMP not reviewed this encounter.   Noe Gens, [place] 04/22/20 [phone]

## 2020-04-21 NOTE — Discharge Instructions (Signed)
  You may take 500mg acetaminophen every 4-6 hours or in combination with ibuprofen 400-600mg every 6-8 hours as needed for pain, inflammation, and fever.  Be sure to well hydrated with clear liquids and get at least 8 hours of sleep at night, preferably more while sick.   Please follow up with family medicine in 1 week if needed.   

## 2020-04-21 NOTE — ED Triage Notes (Signed)
Cough & congestion x 2 weeks - pt started coughing up green tinged mucus yesterday Has been taking sudafed Ibuprofen 400mg  at 1900 Pt has sinus pressure  Denies jaw or tooth pain Covid Vaccine - Pfizer (March 2021)

## 2020-05-03 ENCOUNTER — Emergency Department (INDEPENDENT_AMBULATORY_CARE_PROVIDER_SITE_OTHER): Payer: BC Managed Care – PPO

## 2020-05-03 ENCOUNTER — Other Ambulatory Visit: Payer: Self-pay

## 2020-05-03 ENCOUNTER — Emergency Department (INDEPENDENT_AMBULATORY_CARE_PROVIDER_SITE_OTHER)
Admission: EM | Admit: 2020-05-03 | Discharge: 2020-05-03 | Disposition: A | Discharge status: Home / Self Care | Payer: BC Managed Care – PPO | Source: Home / Self Care

## 2020-05-03 DIAGNOSIS — J40 Bronchitis, not specified as acute or chronic: Secondary | ICD-10-CM | POA: Diagnosis not present

## 2020-05-03 DIAGNOSIS — R0789 Other chest pain: Secondary | ICD-10-CM

## 2020-05-03 DIAGNOSIS — R079 Chest pain, unspecified: Secondary | ICD-10-CM | POA: Diagnosis not present

## 2020-05-03 NOTE — ED Triage Notes (Signed)
Patient presents to Urgent Care with complaints of sharp left sided chest pain since two days ago. Patient reports her PCP recommended she come in for a CKG. Pt states the pain started under her left axilla and gradually moved across to the center of her chest, pain is not better or worse w/ movement, but seems to fluctuate randomly.

## 2020-05-03 NOTE — Discharge Instructions (Signed)
  You have mild inflammation in your bronchial tubes in your lungs. There is no fluid seen in your lungs.  You do not have a fever or difficulty breathing.  The inflammation is likely from your recent upper respiratory infection that was treated with amoxicillin.   This could be the source of your pain. You may try over the counter Tylenol and Motrin for pain.   Be sure to stay well hydrated and follow up with your family doctor next week if not improving.  Call 911 or go to the hospital if symptoms worsening- worsening chest pain, trouble breathing, nausea/vomiting, dizziness, passing out, or other new concerning symptoms develop.

## 2020-05-03 NOTE — ED Provider Notes (Signed)
[name]    CSN: [id number] Arrival date & time: 05/03/20  1704      History   Chief Complaint Chief Complaint  Patient presents with  . Chest Pain    HPI Kaylee Brewer is a 44 y.o. female.   HPI  Kaylee Brewer is a 44 y.o. female presenting to UC with c/o intermittent Left sided chest pain described as sharp stabbing twinges in her chest. Symptoms started yesterday, worsening and more frequent today.  Yesterday, pain was more in her axillary region and left upper breast but today it is more in her chest, lasts a few seconds at a time but keeps recurring. She has not taken anything for the pain. She has tried stretching her Left arm but no relief. Nothing seems to cause the pain. Denies difficulty breathing, sweats or nausea. No cough, congestion fever or chills.  No recent injury.  No hx of CAD or HTN.  She does have borderline high cholesterol, she is trying to manage with diet.  Both parents have high blood pressure but no hx of MIs.    Pt was seen at [facility] on 04/21/20, tx with amoxicillin for sinusitis. Pt states that congestion has improved significantly, she only has minimal congestion now.   Past Medical History:  Diagnosis Date  . Breast abscess     Patient Active Problem List   Diagnosis Date Noted  . Active labor 08/27/2014  . Breast abscess of female 10/24/2013    Past Surgical History:  Procedure Laterality Date  . APPENDECTOMY  2011    OB History    Gravida  3   Para  2   Term  2   Preterm      AB  1   Living  2     SAB      TAB  1   Ectopic      Multiple      Live Births  2            Home Medications    Prior to Admission medications   Medication Sig Start Date End Date Taking? Authorizing Provider  amoxicillin (AMOXIL) 500 MG capsule Take 1 capsule (500 mg total) by mouth 3 (three) times daily. 04/21/20   Noe Gens, PA-C  B-Complex-C-Biotin-Fe & FA (DIALYVITE 800/IRON PO) Take 1 tablet by mouth daily.     [provider]  chlorhexidine (PERIDEX) 0.12 % solution Use as directed 15 mLs in the mouth or throat 2 (two) times daily. 12/16/18   Robyn Haber, MD  ibuprofen (ADVIL,MOTRIN) 600 MG tablet Take 1 tablet (600 mg total) by mouth every 6 (six) hours. 08/29/14   Juanda Chance, NP  ipratropium (ATROVENT) 0.06 % nasal spray Place 2 sprays into both nostrils 4 (four) times daily. 04/21/20   Noe Gens, PA-C  levothyroxine (SYNTHROID, LEVOTHROID) 25 MCG tablet Take 25 mcg by mouth daily before breakfast.    [provider]  Prenatal Vit-Fe Fumarate-FA (PRENATAL MULTIVITAMIN) TABS tablet Take 1 tablet by mouth daily at 12 noon.    [provider]    Family History Family History  Problem Relation Age of Onset  . Uterine cancer Mother   . Thyroid cancer Mother     Social History Social History   Tobacco Use  . Smoking status: Never Smoker  . Smokeless tobacco: Never Used  Vaping Use  . Vaping Use: Never used  Substance Use Topics  . Alcohol use: No  . Drug use: No  Allergies   Patient has no known allergies.   Review of Systems Review of Systems  Constitutional: Negative for chills and fever.  HENT: Negative for congestion.   Respiratory: Negative for cough, chest tightness and shortness of breath.   Cardiovascular: Positive for chest pain. Negative for palpitations.  Gastrointestinal: Negative for abdominal pain, diarrhea, nausea and vomiting.  Neurological: Negative for dizziness, light-headedness and headaches.     Physical Exam Triage Vital Signs ED Triage Vitals  Enc Vitals Group     BP      Pulse      Resp      Temp      Temp src      SpO2      Weight      Height      Head Circumference      Peak Flow      Pain Score      Pain Loc      Pain Edu?      Excl. in [place]?    No data found.  Updated Vital Signs BP 100/65 (BP Location: Left Arm)   Pulse 76   Temp 98.6 F (37 C) (Oral)   Resp 18   LMP 04/19/2020 (Exact Date)    SpO2 99%   Visual Acuity Right Eye Distance:   Left Eye Distance:   Bilateral Distance:    Right Eye Near:   Left Eye Near:    Bilateral Near:     Physical Exam Vitals and nursing note reviewed.  Constitutional:      Appearance: She is well-developed.  HENT:     Head: Normocephalic and atraumatic.  Cardiovascular:     Rate and Rhythm: Normal rate and regular rhythm.  Pulmonary:     Effort: Pulmonary effort is normal.     Breath sounds: No decreased breath sounds, wheezing, rhonchi or rales.  Chest:    Musculoskeletal:        General: Normal range of motion.     Cervical back: Normal range of motion.  Skin:    General: Skin is warm and dry.  Neurological:     Mental Status: She is alert and oriented to person, place, and time.  Psychiatric:        Behavior: Behavior normal.      UC Treatments / Results  Labs (all labs ordered are listed, but only abnormal results are displayed) Labs Reviewed - No data to display  EKG   Radiology DG Chest 2 View  Result Date: 05/03/2020 CLINICAL DATA:  LEFT upper chest pain for 2 days EXAM: CHEST - 2 VIEW COMPARISON:  None FINDINGS: Normal heart size, mediastinal contours, and pulmonary vascularity. Mild peribronchial thickening. No pulmonary infiltrate, pleural effusion, or pneumothorax. Bones unremarkable. IMPRESSION: Bronchitic changes without infiltrate. Electronically Signed   By: Lavonia Dana M.D.   On: 05/03/2020 18:01    Date/Time: 05/03/2020    17:17:01 Ventricular Rate: 75 PR Interval: 118 QRS Duration: 84 QT Interval: 388 QTC Calculation: 433 P-R-T axes: 83   83   29 Text Interpretation: Normal sinus rhythm. Normal ECG.  No prior to compare.   Procedures Procedures (including critical care time)  Medications Ordered in UC Medications - No data to display  Initial Impression / Assessment and Plan / UC Course  I have reviewed the triage vital signs and the nursing notes.  Pertinent labs & imaging  results that were available during my care of the patient were reviewed by me and considered in my  medical decision making (see chart for details).     Chest pain atypical for ACS EKG: normal CXR: mild peribronchial thickening w/o infiltrate. Pt appears well, NAD. Afebrile. No evidence of bacterial infection at this time. Encouraged symptomatic tx. Offered prednisone, pt declined, will tx OTC ibuprofen Encouraged f/u with PCP as needed  Discussed symptoms that warrant emergent care in the ED. AVS provided   Final Clinical Impressions(s) / UC Diagnoses   Final diagnoses:  Nonspecific chest pain  Left-sided chest pain  Bronchitis     Discharge Instructions      You have mild inflammation in your bronchial tubes in your lungs. There is no fluid seen in your lungs.  You do not have a fever or difficulty breathing.  The inflammation is likely from your recent upper respiratory infection that was treated with amoxicillin.   This could be the source of your pain. You may try over the counter Tylenol and Motrin for pain.   Be sure to stay well hydrated and follow up with your family doctor next week if not improving.  Call 911 or go to the hospital if symptoms worsening- worsening chest pain, trouble breathing, nausea/vomiting, dizziness, passing out, or other new concerning symptoms develop.     ED Prescriptions    None     PDMP not reviewed this encounter.   Noe Gens, [place] 05/03/20 1817

## 2020-08-16 ENCOUNTER — Other Ambulatory Visit: Payer: Self-pay | Admitting: Obstetrics and Gynecology

## 2020-08-16 DIAGNOSIS — N6320 Unspecified lump in the left breast, unspecified quadrant: Secondary | ICD-10-CM | POA: Diagnosis not present

## 2020-08-16 DIAGNOSIS — N63 Unspecified lump in unspecified breast: Secondary | ICD-10-CM

## 2020-08-16 DIAGNOSIS — N6002 Solitary cyst of left breast: Secondary | ICD-10-CM

## 2020-08-22 ENCOUNTER — Other Ambulatory Visit: Payer: BC Managed Care – PPO

## 2020-08-23 DIAGNOSIS — R922 Inconclusive mammogram: Secondary | ICD-10-CM | POA: Diagnosis not present

## 2020-08-23 DIAGNOSIS — N6322 Unspecified lump in the left breast, upper inner quadrant: Secondary | ICD-10-CM | POA: Diagnosis not present

## 2020-08-24 DIAGNOSIS — N6310 Unspecified lump in the right breast, unspecified quadrant: Secondary | ICD-10-CM | POA: Diagnosis not present

## 2020-08-27 ENCOUNTER — Other Ambulatory Visit: Payer: BC Managed Care – PPO

## 2020-08-28 DIAGNOSIS — M940 Chondrocostal junction syndrome [Tietze]: Secondary | ICD-10-CM | POA: Diagnosis not present

## 2020-08-28 DIAGNOSIS — R928 Other abnormal and inconclusive findings on diagnostic imaging of breast: Secondary | ICD-10-CM | POA: Diagnosis not present

## 2020-08-28 DIAGNOSIS — Z1239 Encounter for other screening for malignant neoplasm of breast: Secondary | ICD-10-CM | POA: Diagnosis not present

## 2020-08-28 DIAGNOSIS — D242 Benign neoplasm of left breast: Secondary | ICD-10-CM | POA: Diagnosis not present

## 2020-08-28 DIAGNOSIS — C50912 Malignant neoplasm of unspecified site of left female breast: Secondary | ICD-10-CM | POA: Diagnosis not present

## 2020-08-28 DIAGNOSIS — N644 Mastodynia: Secondary | ICD-10-CM | POA: Diagnosis not present

## 2020-08-30 ENCOUNTER — Other Ambulatory Visit: Payer: BC Managed Care – PPO

## 2020-08-30 DIAGNOSIS — R928 Other abnormal and inconclusive findings on diagnostic imaging of breast: Secondary | ICD-10-CM | POA: Diagnosis not present

## 2020-08-30 DIAGNOSIS — Z8489 Family history of other specified conditions: Secondary | ICD-10-CM | POA: Diagnosis not present

## 2020-08-30 DIAGNOSIS — C50212 Malignant neoplasm of upper-inner quadrant of left female breast: Secondary | ICD-10-CM | POA: Diagnosis not present

## 2020-08-30 DIAGNOSIS — D242 Benign neoplasm of left breast: Secondary | ICD-10-CM | POA: Diagnosis not present

## 2020-08-31 DIAGNOSIS — C50212 Malignant neoplasm of upper-inner quadrant of left female breast: Secondary | ICD-10-CM | POA: Diagnosis not present

## 2020-09-04 ENCOUNTER — Ambulatory Visit: Payer: Self-pay | Admitting: Surgery

## 2020-09-04 DIAGNOSIS — C50912 Malignant neoplasm of unspecified site of left female breast: Secondary | ICD-10-CM

## 2020-09-04 DIAGNOSIS — Z170 Estrogen receptor positive status [ER+]: Secondary | ICD-10-CM

## 2020-09-04 NOTE — H&P (Signed)
Kaylee Brewer Appointment: 09/04/2020 9:40 AM Location: [facility] Patient #: [id number] DOB: [birth year] Married / Language: Kaylee Brewer / Race: White Female  History of Present Illness Kaylee Moores A. Angelyse Heslin Kaylee Brewer; 09/04/2020 12:41 PM) Patient words: Patient presents for evaluation of left breast pain. She developed diffuse left breast pain 1 month ago. She underwent mammogram which showed 2 lesions in the upper outer one of the upper inner quadrant left breast. Each measured about a centimeter. 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 Remote family history of breast cancer. She has some bruising from the biopsy but otherwise no other complaints.  The patient is a 44 year old female.   Past Surgical History (Kaylee Brewer, Kaylee Brewer; 09/04/2020 9:28 AM) Appendectomy Breast Biopsy Left.  Diagnostic Studies History (Kaylee Brewer, Kaylee Brewer; 09/04/2020 9:28 AM) Colonoscopy never Mammogram within last year Pap Smear 1-5 years ago  Allergies (Kaylee Brewer, Kaylee Brewer; 09/04/2020 9:28 AM) No Known Drug Allergies [02/01/2015]: Allergies Reconciled  Medication History (Kaylee Brewer, Kaylee Brewer; 09/04/2020 9:28 AM) No Current Medications Medications Reconciled  Social History (Kaylee Brewer, Kaylee Brewer; 09/04/2020 9:28 AM) Alcohol use Occasional alcohol use. Caffeine use Coffee. No drug use Tobacco use Former smoker, Never smoker.  Family History (Kaylee Brewer, Brewer; 09/04/2020 9:28 AM) Cancer Mother. Diabetes Mellitus Father, Mother. Hypertension Father, Mother. Kidney Disease Mother. Thyroid problems Mother.  Pregnancy / Birth History (Kaylee Brewer, Brewer; 09/04/2020 9:28 AM) Age at menarche 22 years. Gravida 3 Length (months) of breastfeeding 3-6 Maternal age 25-25 Para 2 Regular  periods  Other Problems (Kaylee Brewer, Kaylee Brewer; 09/04/2020 9:28 AM) No pertinent past medical history     Review of Systems (Kaylee Brewer; 09/04/2020 9:28 AM) General Not Present- Appetite Loss, Chills, Fatigue, Fever, Night Sweats, Weight Gain and Weight Loss. Skin Not Present- Change in Wart/Mole, Dryness, Hives, Jaundice, New Lesions, Non-Healing Wounds, Rash and Ulcer. HEENT Not Present- Earache, Hearing Loss, Hoarseness, Nose Bleed, Oral Ulcers, Ringing in the Ears, Seasonal Allergies, Sinus Pain, Sore Throat, Visual Disturbances, Wears glasses/contact lenses and Yellow Eyes. Respiratory Present- Difficulty Breathing. Not Present- Bloody sputum, Chronic Cough, Snoring and Wheezing. Breast Present- Breast Mass and Breast Pain. Not Present- Nipple Discharge and Skin Changes. Cardiovascular Present- Shortness of Breath. Not Present- Chest Pain, Difficulty Breathing Lying Down, Leg Cramps, Palpitations, Rapid Heart Rate and Swelling of Extremities. Gastrointestinal Not Present- Abdominal Pain, Bloating, Bloody Stool, Change in Bowel Habits, Chronic diarrhea, Constipation, Difficulty Swallowing, Excessive gas, Gets full quickly at meals, Hemorrhoids, Indigestion, Nausea, Rectal Pain and Vomiting. Female Genitourinary Not Present- Frequency, Nocturia, Painful Urination, Pelvic Pain and Urgency. Musculoskeletal Not Present- Back Pain, Joint Pain, Joint Stiffness, Muscle Pain, Muscle Weakness and Swelling of Extremities. Neurological Not Present- Decreased Memory, Fainting, Headaches, Numbness, Seizures, Tingling, Tremor, Trouble walking and Weakness. Psychiatric Not Present- Anxiety, Bipolar, Change in Sleep Pattern, Depression, Fearful and Frequent crying. Endocrine Not Present- Cold Intolerance, Excessive Hunger, Hair Changes, Heat Intolerance, Hot flashes and New Diabetes. Hematology Not Present- Blood Thinners, Easy Bruising, Excessive bleeding, Gland problems, HIV and Persistent  Infections.  Vitals (Kaylee Brewer; 09/04/2020 9:29 AM) 09/04/2020 9:28 AM Weight: 135 lb Height: 64in Body Surface Area: 1.66 m Body Mass Index: 23.17 kg/m  Temp.: 98.90F  Pulse: 106 (Regular)  BP: 132/74(Sitting, Left Arm, Standard)        Physical Exam (Kaylee Pelle A. Dinesh Ulysse  Kaylee Brewer; 09/04/2020 12:42 PM)  General Mental Status-Alert. General Appearance-Consistent with stated age. Hydration-Well hydrated. Voice-Normal.  Chest and Lung Exam Chest and lung exam reveals -quiet, even and easy respiratory effort with no use of accessory muscles and on auscultation, normal breath sounds, no adventitious sounds and normal vocal resonance. Inspection Chest Wall - Normal. Back - normal.  Breast Note: Bruising upper inner and upper outer quadrant left breast. No mass lesion. Right breast shows no mass lesion.  Cardiovascular Cardiovascular examination reveals -normal heart sounds, regular rate and rhythm with no murmurs and normal pedal pulses bilaterally.  Neurologic Neurologic evaluation reveals -alert and oriented x 3 with no impairment of recent or remote memory. Mental Status-Normal.  Lymphatic Head & Neck  General Head & Neck Lymphatics: Bilateral - Description - Normal. Axillary  General Axillary Region: Bilateral - Description - Normal. Tenderness - Non Tender.    Assessment & Plan (Kaylee Hamler A. Mersadez Linden Kaylee Brewer; 09/04/2020 12:43 PM)  BREAST CANCER, STAGE 1, LEFT (C50.912) Impression: Discussed breast conserving surgery versus mastectomy with or without reconstruction. Discussed genetic testing discussed the role of chemotherapy and radiation therapy with breast conserving surgery and vasectomy. Discussed the pros and cons of each approach. She has opted for a left breast seed localized lumpectomy with left axillary sentinel lymph node mapping. She already has an magnetic resonance imaging scheduled. She will be referred to medical and radiation  oncology for further evaluation. Risk of lumpectomy include bleeding, infection, seroma, more surgery, use of seed/wire, wound care, cosmetic deformity and the need for other treatments, death , blood clots, death. Pt agrees to proceed. Risk of sentinel lymph node mapping include bleeding, infection, lymphedema, shoulder pain. Lymphedema stiffness, dye allergy. cosmetic deformity , blood clots, death, need for more surgery. Pt agrees to proceed.  45 min total time  Current Plans You are being scheduled for surgery- Our schedulers will call you.  You should hear from our office's scheduling department within 5 working days about the location, date, and time of surgery. We try to make accommodations for patient's preferences in scheduling surgery, but sometimes the OR schedule or the surgeon's schedule prevents [place] from making those accommodations.  If you have not heard from our office [phone]) in 5 working days, call the office and ask for your surgeon's nurse.  If you have other questions about your diagnosis, plan, or surgery, call the office and ask for your surgeon's nurse.  Pt Education - [facility] Breast Cancer Information Given - [facility] "Breast Journey" Package We discussed the staging and pathophysiology of breast cancer. We discussed all of the different options for treatment for breast cancer including surgery, chemotherapy, radiation therapy, Herceptin, and antiestrogen therapy. We discussed a sentinel lymph node biopsy as she does not appear to having lymph node involvement right now. We discussed the performance of that with injection of radioactive tracer and blue dye. We discussed that she would have an incision underneath her axillary hairline. We discussed that there is a bout a 10-20% chance of having a positive node with a sentinel lymph node biopsy and we will await the permanent pathology to make any other first further decisions in terms of her treatment. One of these options  might be to return to the operating room to perform an axillary lymph node dissection. We discussed about a 1-2% risk lifetime of chronic shoulder pain as well as lymphedema associated with a sentinel lymph node biopsy. We discussed the options for treatment of the breast cancer which included lumpectomy versus  a mastectomy. We discussed the performance of the lumpectomy with a wire placement. We discussed a 10-20% chance of a positive margin requiring reexcision in the operating room. We also discussed that she may need radiation therapy or antiestrogen therapy or both if she undergoes lumpectomy. We discussed the mastectomy and the postoperative care for that as well. We discussed that there is no difference in her survival whether she undergoes lumpectomy with radiation therapy or antiestrogen therapy versus a mastectomy. There is a slight difference in the local recurrence rate being 3-5% with lumpectomy and about 1% with a mastectomy. We discussed the risks of operation including bleeding, infection, possible reoperation. She understands her further therapy will be based on what her stages at the time of her operation.  Pt Education - [facility] breast cancer surgery: discussed with patient and provided information. Pt Education - ABC (After Breast Cancer) Class Info: discussed with patient and provided information.

## 2020-09-05 ENCOUNTER — Telehealth: Payer: Self-pay | Admitting: *Deleted

## 2020-09-05 DIAGNOSIS — R928 Other abnormal and inconclusive findings on diagnostic imaging of breast: Secondary | ICD-10-CM | POA: Diagnosis not present

## 2020-09-05 DIAGNOSIS — Z170 Estrogen receptor positive status [ER+]: Secondary | ICD-10-CM | POA: Diagnosis not present

## 2020-09-05 DIAGNOSIS — C50312 Malignant neoplasm of lower-inner quadrant of left female breast: Secondary | ICD-10-CM | POA: Diagnosis not present

## 2020-09-05 DIAGNOSIS — C50212 Malignant neoplasm of upper-inner quadrant of left female breast: Secondary | ICD-10-CM | POA: Diagnosis not present

## 2020-09-05 NOTE — Telephone Encounter (Signed)
LVM for call back to schedule an appointment with Dr. Moody. °

## 2020-09-06 ENCOUNTER — Telehealth: Payer: Self-pay | Admitting: Radiation Oncology

## 2020-09-06 NOTE — Telephone Encounter (Signed)
Called pt to sch consult w/ Dr. Lisbeth Renshaw per the referral from Dr. Brantley Stage. Pt stated that she has switched surgeons and now has care scheduled elsewhere. Will inform [facility].

## 2020-09-07 ENCOUNTER — Other Ambulatory Visit: Payer: Self-pay | Admitting: Surgery

## 2020-09-07 DIAGNOSIS — N6022 Fibroadenosis of left breast: Secondary | ICD-10-CM | POA: Diagnosis not present

## 2020-09-07 DIAGNOSIS — C50912 Malignant neoplasm of unspecified site of left female breast: Secondary | ICD-10-CM

## 2020-09-07 DIAGNOSIS — Z170 Estrogen receptor positive status [ER+]: Secondary | ICD-10-CM | POA: Diagnosis not present

## 2020-09-11 DIAGNOSIS — Z170 Estrogen receptor positive status [ER+]: Secondary | ICD-10-CM | POA: Diagnosis not present

## 2020-09-11 DIAGNOSIS — C50312 Malignant neoplasm of lower-inner quadrant of left female breast: Secondary | ICD-10-CM | POA: Diagnosis not present

## 2020-09-11 DIAGNOSIS — Z01812 Encounter for preprocedural laboratory examination: Secondary | ICD-10-CM | POA: Diagnosis not present

## 2020-09-11 DIAGNOSIS — Z20822 Contact with and (suspected) exposure to covid-19: Secondary | ICD-10-CM | POA: Diagnosis not present

## 2020-09-12 DIAGNOSIS — Z170 Estrogen receptor positive status [ER+]: Secondary | ICD-10-CM | POA: Diagnosis not present

## 2020-09-12 DIAGNOSIS — C50212 Malignant neoplasm of upper-inner quadrant of left female breast: Secondary | ICD-10-CM | POA: Diagnosis not present

## 2020-09-13 DIAGNOSIS — N6323 Unspecified lump in the left breast, lower outer quadrant: Secondary | ICD-10-CM | POA: Diagnosis not present

## 2020-09-13 DIAGNOSIS — N6082 Other benign mammary dysplasias of left breast: Secondary | ICD-10-CM | POA: Diagnosis not present

## 2020-09-13 DIAGNOSIS — N6002 Solitary cyst of left breast: Secondary | ICD-10-CM | POA: Diagnosis not present

## 2020-09-13 DIAGNOSIS — N6042 Mammary duct ectasia of left breast: Secondary | ICD-10-CM | POA: Diagnosis not present

## 2020-09-13 DIAGNOSIS — D242 Benign neoplasm of left breast: Secondary | ICD-10-CM | POA: Diagnosis not present

## 2020-09-13 DIAGNOSIS — C50312 Malignant neoplasm of lower-inner quadrant of left female breast: Secondary | ICD-10-CM | POA: Diagnosis not present

## 2020-09-13 DIAGNOSIS — Z170 Estrogen receptor positive status [ER+]: Secondary | ICD-10-CM | POA: Diagnosis not present

## 2020-09-20 DIAGNOSIS — N62 Hypertrophy of breast: Secondary | ICD-10-CM | POA: Diagnosis not present

## 2020-09-20 DIAGNOSIS — N641 Fat necrosis of breast: Secondary | ICD-10-CM | POA: Diagnosis not present

## 2020-09-20 DIAGNOSIS — Z170 Estrogen receptor positive status [ER+]: Secondary | ICD-10-CM | POA: Diagnosis not present

## 2020-09-20 DIAGNOSIS — C50912 Malignant neoplasm of unspecified site of left female breast: Secondary | ICD-10-CM | POA: Diagnosis not present

## 2020-09-20 DIAGNOSIS — C50312 Malignant neoplasm of lower-inner quadrant of left female breast: Secondary | ICD-10-CM | POA: Diagnosis not present

## 2020-10-02 DIAGNOSIS — C50212 Malignant neoplasm of upper-inner quadrant of left female breast: Secondary | ICD-10-CM | POA: Diagnosis not present

## 2020-10-02 DIAGNOSIS — Z170 Estrogen receptor positive status [ER+]: Secondary | ICD-10-CM | POA: Diagnosis not present

## 2020-10-09 DIAGNOSIS — Z170 Estrogen receptor positive status [ER+]: Secondary | ICD-10-CM | POA: Diagnosis not present

## 2020-10-09 DIAGNOSIS — C50912 Malignant neoplasm of unspecified site of left female breast: Secondary | ICD-10-CM | POA: Diagnosis not present

## 2020-10-11 DIAGNOSIS — C50212 Malignant neoplasm of upper-inner quadrant of left female breast: Secondary | ICD-10-CM | POA: Diagnosis not present

## 2020-10-11 DIAGNOSIS — Z170 Estrogen receptor positive status [ER+]: Secondary | ICD-10-CM | POA: Diagnosis not present

## 2020-10-17 DIAGNOSIS — Z5111 Encounter for antineoplastic chemotherapy: Secondary | ICD-10-CM | POA: Diagnosis not present

## 2020-10-17 DIAGNOSIS — Z170 Estrogen receptor positive status [ER+]: Secondary | ICD-10-CM | POA: Diagnosis not present

## 2020-10-17 DIAGNOSIS — C50212 Malignant neoplasm of upper-inner quadrant of left female breast: Secondary | ICD-10-CM | POA: Diagnosis not present

## 2020-10-17 DIAGNOSIS — Z7952 Long term (current) use of systemic steroids: Secondary | ICD-10-CM | POA: Diagnosis not present

## 2020-10-17 DIAGNOSIS — Z79899 Other long term (current) drug therapy: Secondary | ICD-10-CM | POA: Diagnosis not present

## 2020-10-19 DIAGNOSIS — Z170 Estrogen receptor positive status [ER+]: Secondary | ICD-10-CM | POA: Diagnosis not present

## 2020-10-19 DIAGNOSIS — Z7689 Persons encountering health services in other specified circumstances: Secondary | ICD-10-CM | POA: Diagnosis not present

## 2020-10-19 DIAGNOSIS — C50212 Malignant neoplasm of upper-inner quadrant of left female breast: Secondary | ICD-10-CM | POA: Diagnosis not present

## 2020-10-22 ENCOUNTER — Emergency Department (HOSPITAL_BASED_OUTPATIENT_CLINIC_OR_DEPARTMENT_OTHER): Payer: BC Managed Care – PPO

## 2020-10-22 ENCOUNTER — Other Ambulatory Visit: Payer: Self-pay

## 2020-10-22 ENCOUNTER — Emergency Department (HOSPITAL_BASED_OUTPATIENT_CLINIC_OR_DEPARTMENT_OTHER)
Admission: EM | Admit: 2020-10-22 | Discharge: 2020-10-22 | Disposition: A | Discharge status: Home / Self Care | Payer: BC Managed Care – PPO | Attending: Emergency Medicine | Admitting: Emergency Medicine

## 2020-10-22 ENCOUNTER — Encounter (HOSPITAL_BASED_OUTPATIENT_CLINIC_OR_DEPARTMENT_OTHER): Payer: Self-pay | Admitting: *Deleted

## 2020-10-22 DIAGNOSIS — Z5321 Procedure and treatment not carried out due to patient leaving prior to being seen by health care provider: Secondary | ICD-10-CM | POA: Insufficient documentation

## 2020-10-22 DIAGNOSIS — R079 Chest pain, unspecified: Secondary | ICD-10-CM | POA: Diagnosis not present

## 2020-10-22 DIAGNOSIS — Z853 Personal history of malignant neoplasm of breast: Secondary | ICD-10-CM | POA: Insufficient documentation

## 2020-10-22 DIAGNOSIS — C50919 Malignant neoplasm of unspecified site of unspecified female breast: Secondary | ICD-10-CM | POA: Diagnosis not present

## 2020-10-22 DIAGNOSIS — R0789 Other chest pain: Secondary | ICD-10-CM | POA: Diagnosis not present

## 2020-10-22 HISTORY — DX: Malignant (primary) neoplasm, unspecified: C80.1

## 2020-10-22 LAB — HEPATIC FUNCTION PANEL
ALT: 22 U/L (ref 0–44)
AST: 16 U/L (ref 15–41)
Albumin: 3.8 g/dL (ref 3.5–5.0)
Alkaline Phosphatase: 56 U/L (ref 38–126)
Bilirubin, Direct: <0.1 mg/dL (ref 0.0–0.2)
Total Bilirubin: 0.5 mg/dL (ref 0.3–1.2)
Total Protein: 6.9 g/dL (ref 6.5–8.1)

## 2020-10-22 LAB — BASIC METABOLIC PANEL
Anion gap: 9 (ref 5–15)
BUN: 15 mg/dL (ref 6–20)
CO2: 25 mmol/L (ref 22–32)
Calcium: 8.9 mg/dL (ref 8.9–10.3)
Chloride: 103 mmol/L (ref 98–111)
Creatinine, Ser: 0.65 mg/dL (ref 0.44–1.00)
GFR, Estimated: >60 mL/min (ref >60)
Glucose, Bld: 101 mg/dL — ABNORMAL HIGH (ref 70–99)
Potassium: 4.4 mmol/L (ref 3.5–5.1)
Sodium: 137 mmol/L (ref 135–145)

## 2020-10-22 LAB — CBC WITH DIFFERENTIAL/PLATELET
Abs Immature Granulocytes: 0.59 10*3/uL — ABNORMAL HIGH (ref 0.00–0.07)
Basophils Absolute: 0.1 10*3/uL (ref 0.0–0.1)
Basophils Relative: 3 %
Eosinophils Absolute: 0.1 10*3/uL (ref 0.0–0.5)
Eosinophils Relative: 2 %
HCT: 35.5 % — ABNORMAL LOW (ref 36.0–46.0)
Hemoglobin: 11.5 g/dL — ABNORMAL LOW (ref 12.0–15.0)
Immature Granulocytes: 13 %
Lymphocytes Relative: 31 %
Lymphs Abs: 1.5 10*3/uL (ref 0.7–4.0)
MCH: 29.7 pg (ref 26.0–34.0)
MCHC: 32.4 g/dL (ref 30.0–36.0)
MCV: 91.7 fL (ref 80.0–100.0)
Monocytes Absolute: 0.7 10*3/uL (ref 0.1–1.0)
Monocytes Relative: 15 %
Neutro Abs: 1.7 10*3/uL (ref 1.7–7.7)
Neutrophils Relative %: 36 %
Platelets: 197 10*3/uL (ref 150–400)
RBC: 3.87 MIL/uL (ref 3.87–5.11)
RDW: 13.9 % (ref 11.5–15.5)
WBC: 4.7 10*3/uL (ref 4.0–10.5)
nRBC: 0 % (ref 0.0–0.2)

## 2020-10-22 LAB — PREGNANCY, URINE: Preg Test, Ur: NEGATIVE

## 2020-10-22 LAB — TROPONIN I (HIGH SENSITIVITY): Troponin I (High Sensitivity): 2 ng/L (ref <18)

## 2020-10-22 LAB — LIPASE, BLOOD: Lipase: 32 U/L (ref 11–51)

## 2020-10-22 NOTE — ED Notes (Signed)
Pt reports feeling better is leaving before seeing MD.

## 2020-10-22 NOTE — Progress Notes (Signed)
No answer when called for CXR

## 2020-10-22 NOTE — ED Triage Notes (Signed)
Chest pressure across her chest for the past 20 minutes. Started after drinking a protein drink. She is a chemo pt for breast cancer. Her 1st chemo was 11/24.

## 2020-10-26 DIAGNOSIS — Z170 Estrogen receptor positive status [ER+]: Secondary | ICD-10-CM | POA: Diagnosis not present

## 2020-10-26 DIAGNOSIS — C50212 Malignant neoplasm of upper-inner quadrant of left female breast: Secondary | ICD-10-CM | POA: Diagnosis not present

## 2020-10-26 DIAGNOSIS — R42 Dizziness and giddiness: Secondary | ICD-10-CM | POA: Diagnosis not present

## 2020-11-07 DIAGNOSIS — M898X9 Other specified disorders of bone, unspecified site: Secondary | ICD-10-CM | POA: Diagnosis not present

## 2020-11-07 DIAGNOSIS — C50212 Malignant neoplasm of upper-inner quadrant of left female breast: Secondary | ICD-10-CM | POA: Diagnosis not present

## 2020-11-07 DIAGNOSIS — Z5111 Encounter for antineoplastic chemotherapy: Secondary | ICD-10-CM | POA: Diagnosis not present

## 2020-11-07 DIAGNOSIS — Z7952 Long term (current) use of systemic steroids: Secondary | ICD-10-CM | POA: Diagnosis not present

## 2020-11-07 DIAGNOSIS — Z170 Estrogen receptor positive status [ER+]: Secondary | ICD-10-CM | POA: Diagnosis not present

## 2020-11-07 DIAGNOSIS — R110 Nausea: Secondary | ICD-10-CM | POA: Diagnosis not present

## 2020-11-07 DIAGNOSIS — Z79899 Other long term (current) drug therapy: Secondary | ICD-10-CM | POA: Diagnosis not present

## 2020-11-07 DIAGNOSIS — R197 Diarrhea, unspecified: Secondary | ICD-10-CM | POA: Diagnosis not present

## 2020-11-08 DIAGNOSIS — Z170 Estrogen receptor positive status [ER+]: Secondary | ICD-10-CM | POA: Diagnosis not present

## 2020-11-08 DIAGNOSIS — Z7689 Persons encountering health services in other specified circumstances: Secondary | ICD-10-CM | POA: Diagnosis not present

## 2020-11-08 DIAGNOSIS — C50212 Malignant neoplasm of upper-inner quadrant of left female breast: Secondary | ICD-10-CM | POA: Diagnosis not present

## 2020-11-15 DIAGNOSIS — C50212 Malignant neoplasm of upper-inner quadrant of left female breast: Secondary | ICD-10-CM | POA: Diagnosis not present

## 2020-11-15 DIAGNOSIS — R197 Diarrhea, unspecified: Secondary | ICD-10-CM | POA: Diagnosis not present

## 2020-11-15 DIAGNOSIS — Z170 Estrogen receptor positive status [ER+]: Secondary | ICD-10-CM | POA: Diagnosis not present

## 2020-11-15 DIAGNOSIS — R110 Nausea: Secondary | ICD-10-CM | POA: Diagnosis not present

## 2021-01-24 ENCOUNTER — Emergency Department
Admission: EM | Admit: 2021-01-24 | Discharge: 2021-01-24 | Disposition: A | Discharge status: Home / Self Care | Payer: BC Managed Care – PPO | Source: Home / Self Care | Attending: Family Medicine | Admitting: Family Medicine

## 2021-01-24 ENCOUNTER — Other Ambulatory Visit: Payer: Self-pay

## 2021-01-24 ENCOUNTER — Encounter: Payer: Self-pay | Admitting: *Deleted

## 2021-01-24 DIAGNOSIS — J020 Streptococcal pharyngitis: Secondary | ICD-10-CM

## 2021-01-24 LAB — POCT RAPID STREP A (OFFICE): Rapid Strep A Screen: NEGATIVE

## 2021-01-24 MED ORDER — AMOXICILLIN 500 MG PO CAPS: 500.0000 mg | ORAL_CAPSULE | Freq: Two times a day (BID) | ORAL | 0 refills | Status: AC

## 2021-01-24 NOTE — ED Provider Notes (Signed)
[name]    CSN: [id number] Arrival date & time: 01/24/21  0859      History   Chief Complaint Chief Complaint  Patient presents with  . Sore Throat    HPI Kaylee Brewer is a 45 y.o. female.   HPI  Patient is a 89-year-old son with sore throat.  She started with sore throat today.  She has no fever or chills.  No congestion.  Her son strep test is positive. Patient is under treatment for breast cancer.  She is receiving radiation.  Past Medical History:  Diagnosis Date  . Breast abscess   . Cancer [facility])     Patient Active Problem List   Diagnosis Date Noted  . Active labor 08/27/2014  . Breast abscess of female 10/24/2013    Past Surgical History:  Procedure Laterality Date  . APPENDECTOMY  2011  . BREAST LUMPECTOMY      OB History    Gravida  3   Para  2   Term  2   Preterm      AB  1   Living  2     SAB      IAB  1   Ectopic      Multiple      Live Births  2            Home Medications    Prior to Admission medications   Medication Sig Start Date End Date Taking? Authorizing Provider  amoxicillin (AMOXIL) 500 MG capsule Take 1 capsule (500 mg total) by mouth 2 (two) times daily. 01/24/21  Yes Raylene Everts, MD    Family History Family History  Problem Relation Age of Onset  . Uterine cancer Mother   . Thyroid cancer Mother     Social History Social History   Tobacco Use  . Smoking status: Never Smoker  . Smokeless tobacco: Never Used  Vaping Use  . Vaping Use: Never used  Substance Use Topics  . Alcohol use: No  . Drug use: No     Allergies   Patient has no known allergies.   Review of Systems Review of Systems See HPI  Physical Exam Triage Vital Signs ED Triage Vitals  Enc Vitals Group     BP 01/24/21 0915 126/73     Pulse Rate 01/24/21 0915 93     Resp 01/24/21 0915 16     Temp 01/24/21 0915 99.1 F (37.3 C)     Temp Source 01/24/21 0915 Oral     SpO2 01/24/21 0915 98 %     Weight  --      Height --      Head Circumference --      Peak Flow --      Pain Score 01/24/21 1008 0     Pain Loc --      Pain Edu? --      Excl. in [place]? --    No data found.  Updated Vital Signs BP 126/73 (BP Location: Right Arm)   Pulse 93   Temp 99.1 F (37.3 C) (Oral)   Resp 16   SpO2 98%     Physical Exam Constitutional:      General: She is not in acute distress.    Appearance: She is well-developed and well-nourished.  HENT:     Head: Normocephalic and atraumatic.     Mouth/Throat:     Pharynx: Posterior oropharyngeal erythema present.  Eyes:     Conjunctiva/sclera: Conjunctivae  normal.     Pupils: Pupils are equal, round, and reactive to light.  Cardiovascular:     Rate and Rhythm: Normal rate.  Pulmonary:     Effort: Pulmonary effort is normal. No respiratory distress.  Abdominal:     Palpations: Abdomen is soft.  Musculoskeletal:        General: No edema. Normal range of motion.     Cervical back: Normal range of motion.  Skin:    General: Skin is warm and dry.  Neurological:     Mental Status: She is alert.      UC Treatments / Results  Labs (all labs ordered are listed, but only abnormal results are displayed) Labs Reviewed  POCT RAPID STREP A (OFFICE)    EKG   Radiology No results found.  Procedures Procedures (including critical care time)  Medications Ordered in UC Medications - No data to display  Initial Impression / Assessment and Plan / UC Course  I have reviewed the triage vital signs and the nursing notes.  Pertinent labs & imaging results that were available during my care of the patient were reviewed by me and considered in my medical decision making (see chart for details).     Window treated with antibiotics even though her strep test is negative.  Doing this for 2 reasons, 1.  she has had direct strep exposure and strep is highly contagious.  2.  she is under care for cancer, and I believe her to be immune compromised. Final  Clinical Impressions(s) / UC Diagnoses   Final diagnoses:  Strep pharyngitis     Discharge Instructions     Take amoxicillin 2 times a day for 10 full days Take 2 doses today Take Tylenol as needed for pain or fever   ED Prescriptions    Medication Sig Dispense Auth. Provider   amoxicillin (AMOXIL) 500 MG capsule Take 1 capsule (500 mg total) by mouth 2 (two) times daily. 20 capsule Raylene Everts, MD     PDMP not reviewed this encounter.   Raylene Everts, MD 01/24/21 1038

## 2021-01-24 NOTE — Discharge Instructions (Addendum)
Take amoxicillin 2 times a day for 10 full days Take 2 doses today Take Tylenol as needed for pain or fever

## 2021-01-24 NOTE — ED Triage Notes (Signed)
Patient c/o sore throat x 2 days. Her son currently has strep. Denies fever. C/o mild congestion.

## 2021-08-31 IMAGING — CR DG CHEST 2V
1 series · 1 of 1 positions shown · non-contrast
Comparison: 05/03/2020

CLINICAL DATA: Chest pressure for 20 minutes, breast cancer

EXAM:
CHEST - 2 VIEW

[w chest lat]
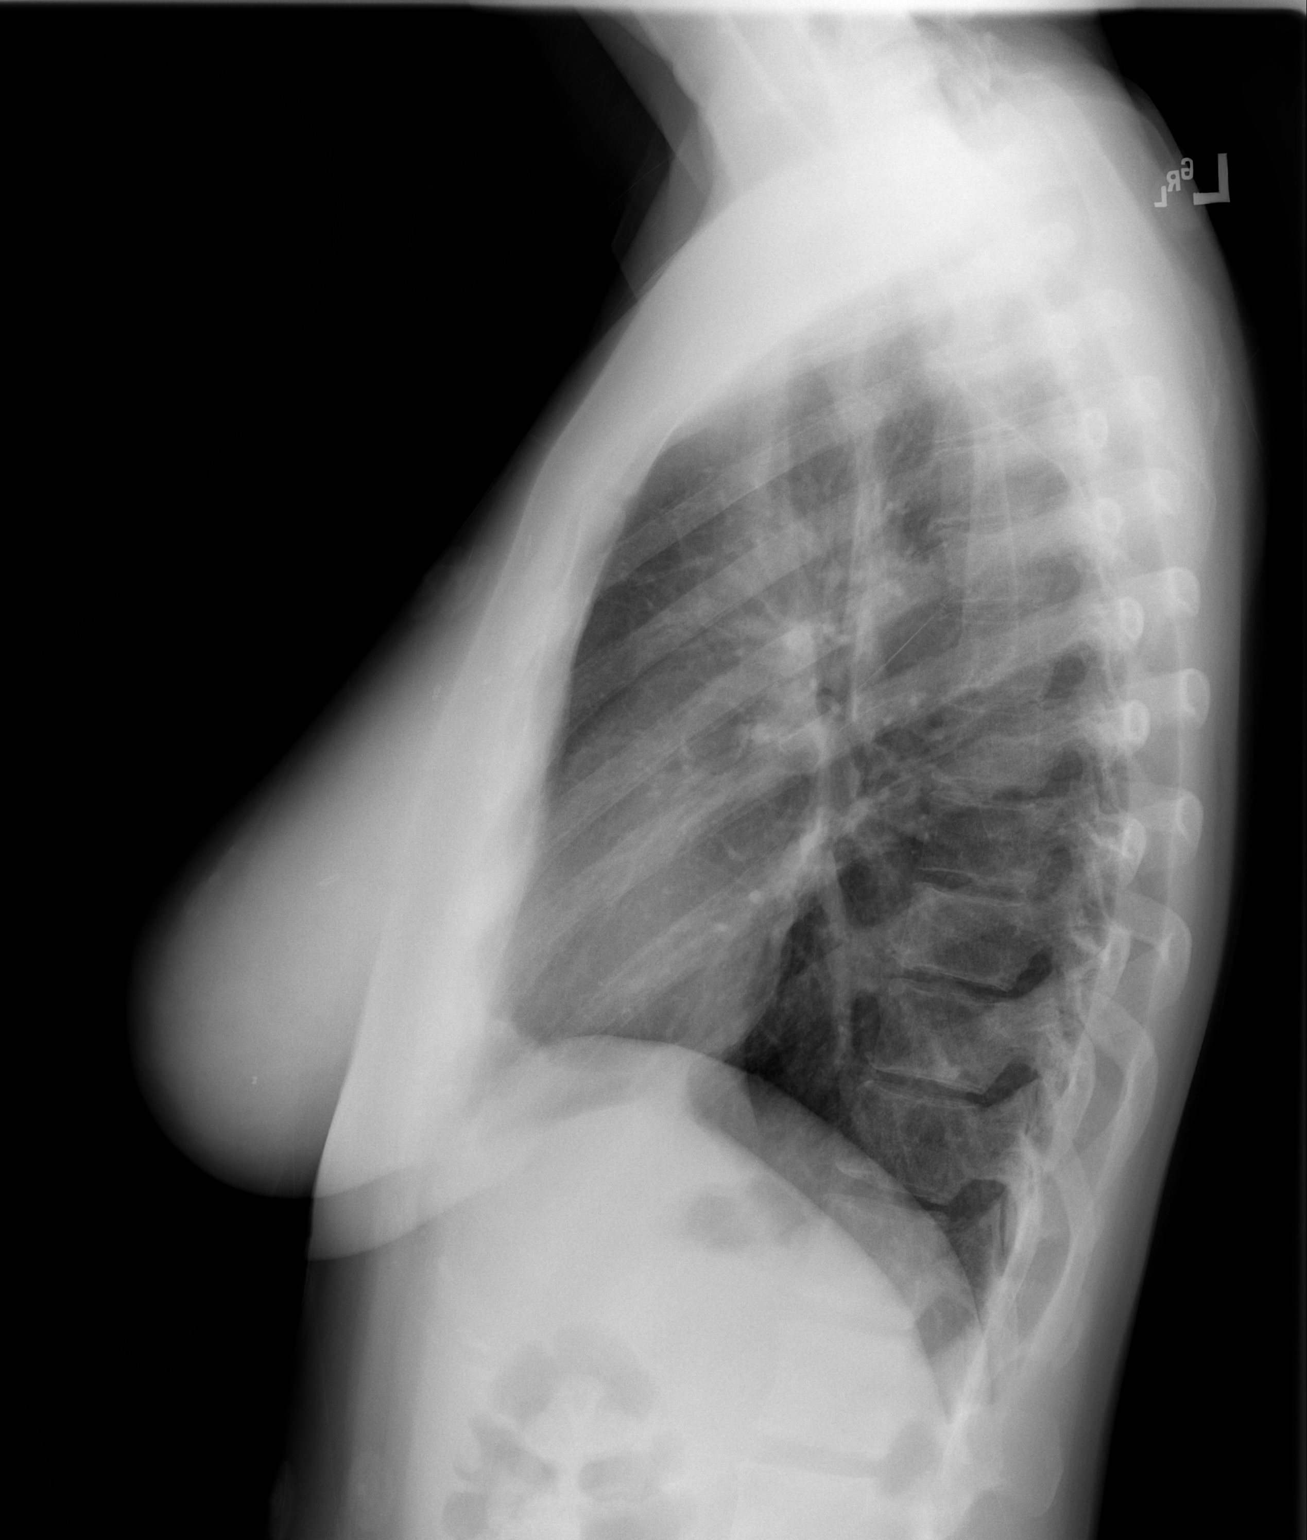

[1 of 1 positions shown; findings below may reference images not displayed]

FINDINGS: Frontal and lateral views of the chest demonstrate an unremarkable
cardiac silhouette. No airspace disease, effusion, or pneumothorax.
Postsurgical changes are seen within the upper inner left breast. No
acute bony abnormalities.
IMPRESSION: 1. No acute intrathoracic process.
